# Patient Record
Sex: Male | Born: 1965 | Race: White | Hispanic: No | Smoking: Former smoker
Health system: Southern US, Community
[De-identification: ages and names within clinical notes are randomized; demographics above are authoritative.]

## PROBLEM LIST (undated history)

## (undated) DIAGNOSIS — I10 Essential (primary) hypertension: Secondary | ICD-10-CM

## (undated) DIAGNOSIS — G709 Myoneural disorder, unspecified: Secondary | ICD-10-CM

## (undated) DIAGNOSIS — E785 Hyperlipidemia, unspecified: Secondary | ICD-10-CM

## (undated) HISTORY — DX: Myoneural disorder, unspecified: G70.9

## (undated) HISTORY — DX: Hemochromatosis, unspecified: E83.119

## (undated) HISTORY — DX: Hyperlipidemia, unspecified: E78.5

## (undated) HISTORY — PX: TONSILLECTOMY: SUR1361

## (undated) HISTORY — PX: NOSE SURGERY: SHX723

## (undated) HISTORY — DX: Essential (primary) hypertension: I10

---

## 2005-12-30 ENCOUNTER — Ambulatory Visit: Payer: Self-pay | Admitting: Otolaryngology

## 2018-05-12 ENCOUNTER — Ambulatory Visit
Admission: RE | Admit: 2018-05-12 | Discharge: 2018-05-12 | Disposition: A | Discharge status: Home / Self Care | Payer: No Typology Code available for payment source | Source: Ambulatory Visit | Attending: Nurse Practitioner | Admitting: Nurse Practitioner

## 2018-05-12 ENCOUNTER — Other Ambulatory Visit: Payer: Self-pay | Admitting: Nurse Practitioner

## 2018-05-12 DIAGNOSIS — Z0000 Encounter for general adult medical examination without abnormal findings: Secondary | ICD-10-CM

## 2018-05-12 IMAGING — CR DG CHEST 2V
2 series · 2 of 2 positions shown · non-contrast
Comparison: None.

CLINICAL DATA: Retirement physical, ex-smoker.

EXAM:
CHEST - 2 VIEW

[w chest pa]
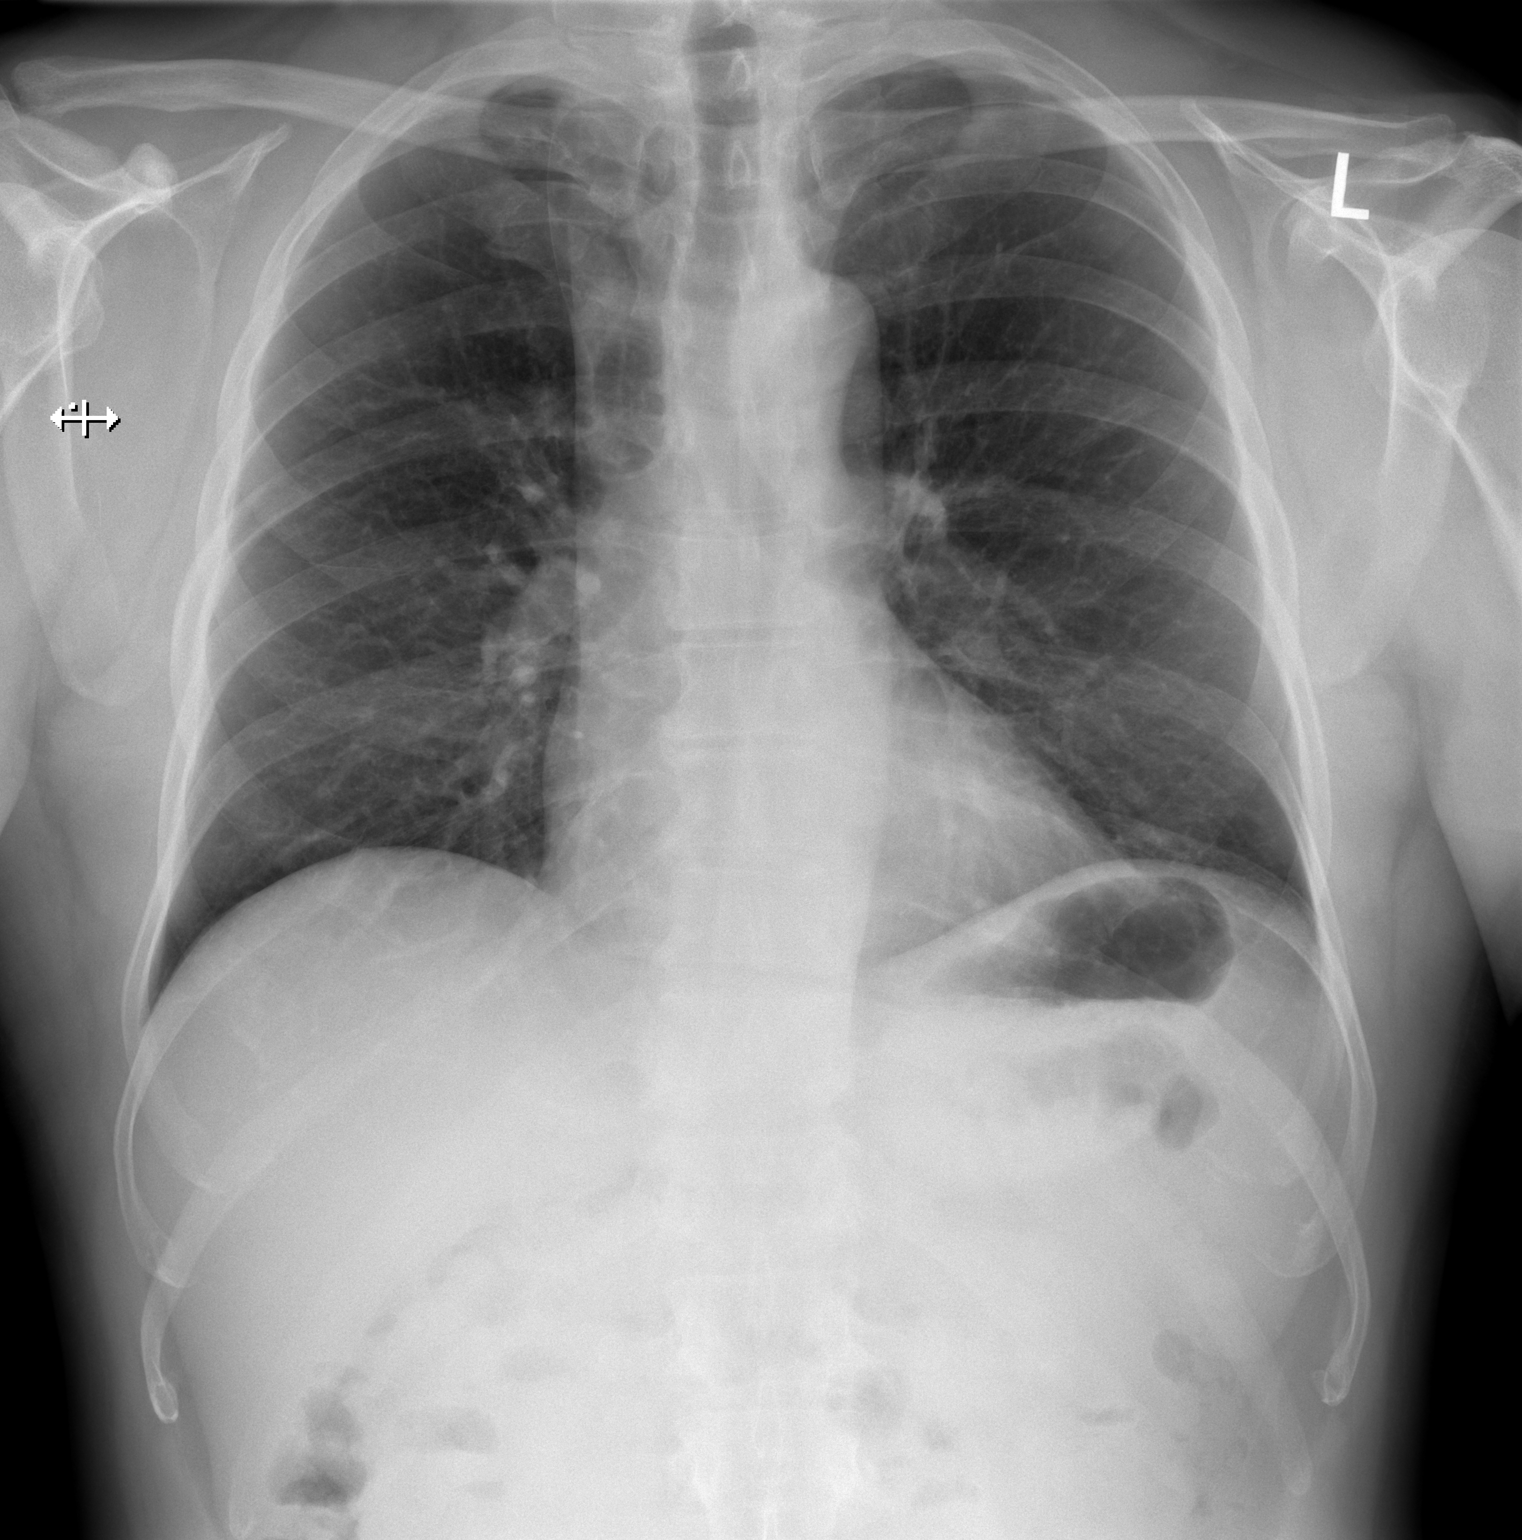

[w chest lat]
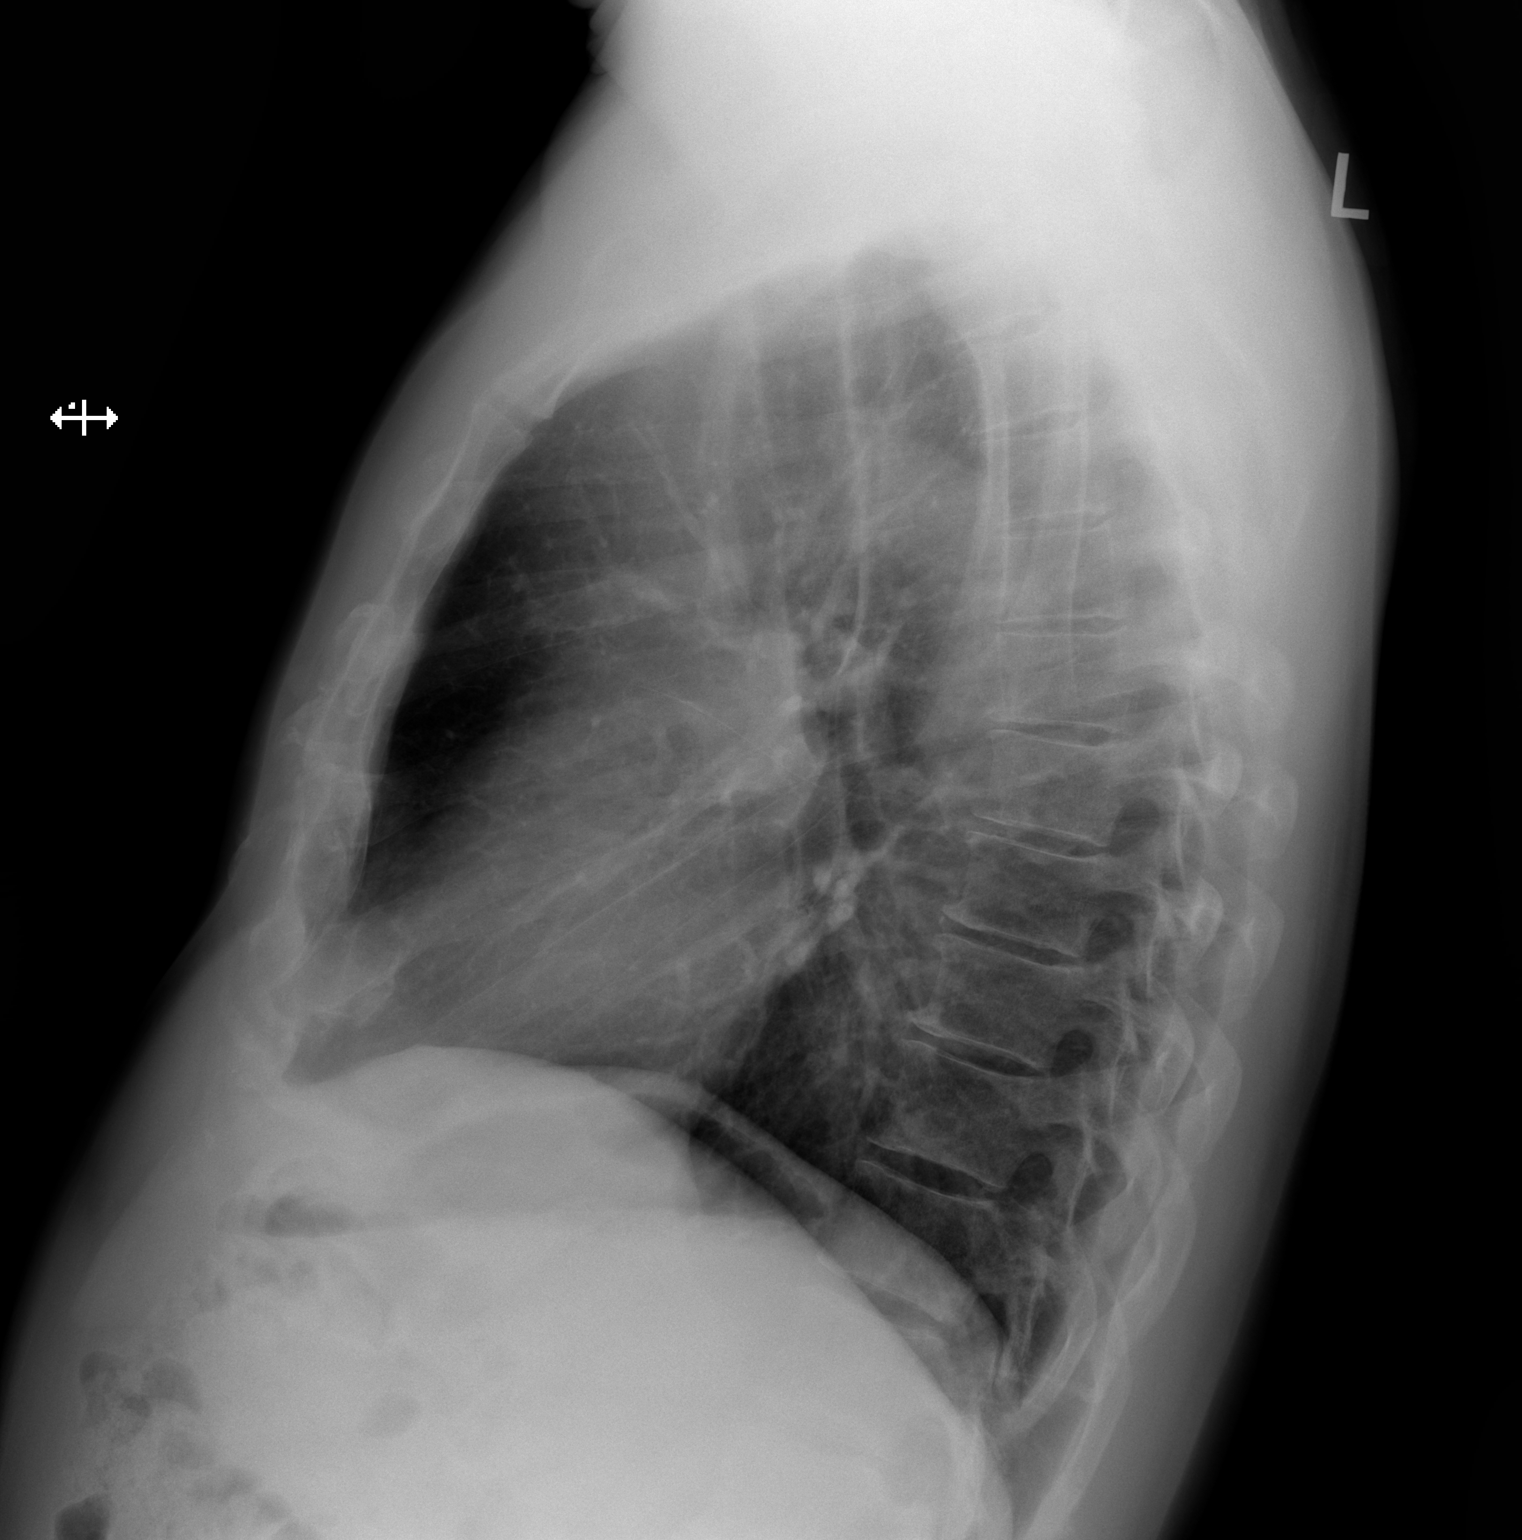

[2 of 2 positions shown; findings below may reference images not displayed]

FINDINGS: Trachea is midline. Heart size normal. Lungs are clear. No pleural
fluid.
IMPRESSION: Negative.

## 2020-01-03 ENCOUNTER — Other Ambulatory Visit: Payer: Self-pay | Admitting: Family Medicine

## 2020-01-03 ENCOUNTER — Ambulatory Visit: Payer: No Typology Code available for payment source | Admitting: Family Medicine

## 2020-01-03 VITALS — BP 174/110 | HR 88 | Ht 71.0 in | Wt 200.0 lb

## 2020-01-03 DIAGNOSIS — Z8669 Personal history of other diseases of the nervous system and sense organs: Secondary | ICD-10-CM

## 2020-01-03 DIAGNOSIS — R030 Elevated blood-pressure reading, without diagnosis of hypertension: Secondary | ICD-10-CM

## 2020-01-03 NOTE — Patient Instructions (Addendum)
- Call and schedule an appointment with an eye doctor  - Get established with a primary care doctor -Come back next week for a recheck of your blood pressure    DASH Eating Plan DASH stands for "Dietary Approaches to Stop Hypertension." The DASH eating plan is a healthy eating plan that has been shown to reduce high blood pressure (hypertension). It may also reduce your risk for type 2 diabetes, heart disease, and stroke. The DASH eating plan may also help with weight loss. What are tips for following this plan?  General guidelines  Avoid eating more than 2,300 mg (milligrams) of salt (sodium) a day. If you have hypertension, you may need to reduce your sodium intake to 1,500 mg a day.  Limit alcohol intake to no more than 1 drink a day for nonpregnant women and 2 drinks a day for men. One drink equals 12 oz of beer, 5 oz of wine, or 1 oz of hard liquor.  Work with your health care provider to maintain a healthy body weight or to lose weight. Ask what an ideal weight is for you.  Get at least 30 minutes of exercise that causes your heart to beat faster (aerobic exercise) most days of the week. Activities may include walking, swimming, or biking.  Work with your health care provider or diet and nutrition specialist (dietitian) to adjust your eating plan to your individual calorie needs. Reading food labels   Check food labels for the amount of sodium per serving. Choose foods with less than 5 percent of the Daily Value of sodium. Generally, foods with less than 300 mg of sodium per serving fit into this eating plan.  To find whole grains, look for the word "whole" as the first word in the ingredient list. Shopping  Buy products labeled as "low-sodium" or "no salt added."  Buy fresh foods. Avoid canned foods and premade or frozen meals. Cooking  Avoid adding salt when cooking. Use salt-free seasonings or herbs instead of table salt or sea salt. Check with your health care provider or  pharmacist before using salt substitutes.  Do not fry foods. Cook foods using healthy methods such as baking, boiling, grilling, and broiling instead.  Cook with heart-healthy oils, such as olive, canola, soybean, or sunflower oil. Meal planning  Eat a balanced diet that includes: ? 5 or more servings of fruits and vegetables each day. At each meal, try to fill half of your plate with fruits and vegetables. ? Up to 6-8 servings of whole grains each day. ? Less than 6 oz of lean meat, poultry, or fish each day. A 3-oz serving of meat is about the same size as a deck of cards. One egg equals 1 oz. ? 2 servings of low-fat dairy each day. ? A serving of nuts, seeds, or beans 5 times each week. ? Heart-healthy fats. Healthy fats called Omega-3 fatty acids are found in foods such as flaxseeds and coldwater fish, like sardines, salmon, and mackerel.  Limit how much you eat of the following: ? Canned or prepackaged foods. ? Food that is high in trans fat, such as fried foods. ? Food that is high in saturated fat, such as fatty meat. ? Sweets, desserts, sugary drinks, and other foods with added sugar. ? Full-fat dairy products.  Do not salt foods before eating.  Try to eat at least 2 vegetarian meals each week.  Eat more home-cooked food and less restaurant, buffet, and fast food.  When eating at a  restaurant, ask that your food be prepared with less salt or no salt, if possible. What foods are recommended? The items listed may not be a complete list. Talk with your dietitian about what dietary choices are best for you. Grains Whole-grain or whole-wheat bread. Whole-grain or whole-wheat pasta. Brown rice. [place]. Bulgur. Whole-grain and low-sodium cereals. Pita bread. Low-fat, low-sodium crackers. Whole-wheat flour tortillas. Vegetables Fresh or frozen vegetables (raw, steamed, roasted, or grilled). Low-sodium or reduced-sodium tomato and vegetable juice. Low-sodium or  reduced-sodium tomato sauce and tomato paste. Low-sodium or reduced-sodium canned vegetables. Fruits All fresh, dried, or frozen fruit. Canned fruit in natural juice (without added sugar). Meat and other protein foods Skinless chicken or [place]. Ground chicken or [place]. Pork with fat trimmed off. Fish and seafood. Egg whites. Dried beans, peas, or lentils. Unsalted nuts, nut butters, and seeds. Unsalted canned beans. Lean cuts of beef with fat trimmed off. Low-sodium, lean deli meat. Dairy Low-fat (1%) or fat-free (skim) milk. Fat-free, low-fat, or reduced-fat cheeses. Nonfat, low-sodium ricotta or cottage cheese. Low-fat or nonfat yogurt. Low-fat, low-sodium cheese. Fats and oils Soft margarine without trans fats. Vegetable oil. Low-fat, reduced-fat, or light mayonnaise and salad dressings (reduced-sodium). Canola, safflower, olive, soybean, and sunflower oils. Avocado. Seasoning and other foods Herbs. Spices. Seasoning mixes without salt. Unsalted popcorn and pretzels. Fat-free sweets. What foods are not recommended? The items listed may not be a complete list. Talk with your dietitian about what dietary choices are best for you. Grains Baked goods made with fat, such as croissants, muffins, or some breads. Dry pasta or rice meal packs. Vegetables Creamed or fried vegetables. Vegetables in a cheese sauce. Regular canned vegetables (not low-sodium or reduced-sodium). Regular canned tomato sauce and paste (not low-sodium or reduced-sodium). Regular tomato and vegetable juice (not low-sodium or reduced-sodium). Angie Fava. Olives. Fruits Canned fruit in a light or heavy syrup. Fried fruit. Fruit in cream or butter sauce. Meat and other protein foods Fatty cuts of meat. Ribs. Fried meat. Berniece Salines. Sausage. Bologna and other processed lunch meats. Salami. Fatback. Hotdogs. Bratwurst. Salted nuts and seeds. Canned beans with added salt. Canned or smoked fish. Whole eggs or egg yolks. Chicken or [place]  with skin. Dairy Whole or 2% milk, cream, and half-and-half. Whole or full-fat cream cheese. Whole-fat or sweetened yogurt. Full-fat cheese. Nondairy creamers. Whipped toppings. Processed cheese and cheese spreads. Fats and oils Butter. Stick margarine. Lard. Shortening. Ghee. Bacon fat. Tropical oils, such as coconut, palm kernel, or palm oil. Seasoning and other foods Salted popcorn and pretzels. Onion salt, garlic salt, seasoned salt, table salt, and sea salt. Worcestershire sauce. Tartar sauce. Barbecue sauce. Teriyaki sauce. Soy sauce, including reduced-sodium. Steak sauce. Canned and packaged gravies. Fish sauce. Oyster sauce. Cocktail sauce. Horseradish that you find on the shelf. Ketchup. Mustard. Meat flavorings and tenderizers. Bouillon cubes. Hot sauce and Tabasco sauce. Premade or packaged marinades. Premade or packaged taco seasonings. Relishes. Regular salad dressings. Where to find more information:  National Heart, Lung, and [place]: https://wilson-eaton.com/  American Heart Association: www.heart.org Summary  The DASH eating plan is a healthy eating plan that has been shown to reduce high blood pressure (hypertension). It may also reduce your risk for type 2 diabetes, heart disease, and stroke.  With the DASH eating plan, you should limit salt (sodium) intake to 2,300 mg a day. If you have hypertension, you may need to reduce your sodium intake to 1,500 mg a day.  When on the DASH eating plan, aim to eat more  fresh fruits and vegetables, whole grains, lean proteins, low-fat dairy, and heart-healthy fats.  Work with your health care provider or diet and nutrition specialist (dietitian) to adjust your eating plan to your individual calorie needs. This information is not intended to replace advice given to you by your health care provider. Make sure you discuss any questions you have with your health care provider. Document Revised: 03/25/2017 Document Reviewed:  04/05/2016 Elsevier Patient Education  2020 [employer].

## 2020-01-03 NOTE — Progress Notes (Signed)
Subjective:     Patient ID: Cameron Herrera, male   DOB: 01/28/1966, 54 y.o.   MRN: [id number]  HPI Cameron Herrera presents to the employee health and wellness clinic today with concerns over double vision. He reports sudden onset of diplopia yesterday 9/8 around lunch time when he was playing solitaire on his phone. States it lasted about 3 minutes and resolved spontaneously. He reports the images were horizontal and overlapping. He reports vision was clear and both images were clear. States the double vision was worse with distance vision. Unsure if it was in one eye or both. He denies any h/a, N/V, drooping eyelid, facial drooping, slurred speech, dizziness, chest pain or shortness of breath. He states he felt completely normal. He looked at himself in the mirror and states his eyes looked normal at the time. Denies any hx of anything similar.   He does not have a PCP. He reports his last physical was his exit physical when he retired from the [employer] in January 2020.  He has never had an eye exam. He states he has a hx of his BP being borderline high in the past, usually 135-140/80-95. States he was never diagnosed with HTN or treated. He denies any PMH, no personal hx of thyroid disease or diabetes. He reports only taking a multivitamin daily, no other supplements or rx medications. He does report daily alcohol use, 3-4 beers a day. He quit smoking 3 years ago but does continue to use smokeless tobacco. He is interested in quitting.   No past medical history on file. No Known Allergies  Current Outpatient Medications:  [name] Kitchen  Multiple Vitamin (MULTIVITAMIN) tablet, Take 1 tablet by mouth daily., Disp: , Rfl:     Review of Systems  Constitutional: Negative for activity change, appetite change, chills, fever and unexpected weight change.  HENT: Negative for congestion, ear pain, facial swelling, sinus pressure, sinus pain, sore throat, tinnitus and trouble swallowing.   Eyes: Positive for visual  disturbance (diplopia, see HPI). Negative for photophobia, pain, discharge, redness and itching.  Respiratory: Negative for cough, shortness of breath and wheezing.   Cardiovascular: Negative for chest pain and palpitations.  Gastrointestinal: Negative for abdominal pain, nausea and vomiting.  Musculoskeletal: Negative for neck pain and neck stiffness.  Skin: Negative for color change.  Neurological: Negative for dizziness, syncope, facial asymmetry, speech difficulty, weakness, light-headedness, numbness and headaches.  Psychiatric/Behavioral: Negative for dysphoric mood. The patient is not nervous/anxious.        Objective:   Physical Exam Vitals reviewed.  Constitutional:      General: He is not in acute distress.    Appearance: Normal appearance. He is not toxic-appearing.  HENT:     Head: Normocephalic and atraumatic.     Jaw: No tenderness.     Comments: No scalp or temporal tenderness on palpation    Right Ear: Tympanic membrane, ear canal and external ear normal.     Left Ear: Tympanic membrane, ear canal and external ear normal.     Nose: Nose normal.  Eyes:     General: Lids are normal. Vision grossly intact. Gaze aligned appropriately.        Right eye: No discharge.        Left eye: No discharge.     Extraocular Movements: Extraocular movements intact.     Right eye: No nystagmus.     Left eye: No nystagmus.     Conjunctiva/sclera: Conjunctivae normal.     Pupils: Pupils are equal,  round, and reactive to light.  Cardiovascular:     Rate and Rhythm: Normal rate and regular rhythm.     Heart sounds: Normal heart sounds.  Pulmonary:     Effort: Pulmonary effort is normal. No respiratory distress.     Breath sounds: Normal breath sounds.  Musculoskeletal:     Cervical back: Neck supple. No tenderness.  Lymphadenopathy:     Cervical: No cervical adenopathy.  Skin:    General: Skin is warm and dry.  Neurological:     General: No focal deficit present.     Mental  Status: He is alert and oriented to person, place, and time.     Cranial Nerves: Cranial nerves are intact. No cranial nerve deficit.     Motor: Motor function is intact. No weakness.     Coordination: Coordination is intact. Coordination normal.     Gait: Gait is intact. Gait normal.  Psychiatric:        Mood and Affect: Mood normal.        Behavior: Behavior normal.    Today's Vitals   01/03/20 1336  BP: (!) 174/110  Pulse: 88  SpO2: 98%  Weight: 200 lb (90.7 kg)  Height: 5\' 11"  (1.803 m)   Body mass index is 27.89 kg/m.     Assessment:     H/O diplopia  Elevated blood pressure reading in office without diagnosis of hypertension      Plan:     1. Acute onset diplopia that resolved spontaneously after a few minutes. Recommend pt schedule an eye exam for further evaluation and get established with a PCP. Will check some lab work here today and notify pt of results. Lipid panel, CBC, CMP, TSH, A1c.  2. BP significantly elevated in office today. He will f/u in 1 week for recheck. If remains elevated then we will discuss starting on BP lowering medication. Discussed importance of DASH diet, exercise. Again, recommend eye exam and management by PCP. 3. Discussed with pt should he develop diplopia again or any other concerning symptoms, like severe h/a, vomiting, slurred speech, weakness, chest pain, shortness of breath, or any other concerning symptoms he should call 911. He verbalized understanding.

## 2020-01-04 LAB — CBC
HCT: 49.6 % (ref 38.5–50.0)
Hemoglobin: 17.6 g/dL — ABNORMAL HIGH (ref 13.2–17.1)
MCH: 33.8 pg — ABNORMAL HIGH (ref 27.0–33.0)
MCHC: 35.5 g/dL (ref 32.0–36.0)
MCV: 95.4 fL (ref 80.0–100.0)
MPV: 10.2 fL (ref 7.5–12.5)
Platelets: 257 10*3/uL (ref 140–400)
RBC: 5.2 10*6/uL (ref 4.20–5.80)
RDW: 12.5 % (ref 11.0–15.0)
WBC: 8.2 10*3/uL (ref 3.8–10.8)

## 2020-01-04 LAB — TSH: TSH: 1.09 mIU/L (ref 0.40–4.50)

## 2020-01-04 LAB — COMPLETE METABOLIC PANEL WITH GFR
AG Ratio: 1.7 (calc) (ref 1.0–2.5)
ALT: 22 U/L (ref 9–46)
AST: 17 U/L (ref 10–35)
Albumin: 4.3 g/dL (ref 3.6–5.1)
Alkaline phosphatase (APISO): 78 U/L (ref 35–144)
BUN: 15 mg/dL (ref 7–25)
CO2: 23 mmol/L (ref 20–32)
Calcium: 9 mg/dL (ref 8.6–10.3)
Chloride: 104 mmol/L (ref 98–110)
Creat: 1.04 mg/dL (ref 0.70–1.33)
GFR, Est African American: 93 mL/min/{1.73_m2} (ref >=60)
GFR, Est Non African American: 80 mL/min/{1.73_m2} (ref >=60)
Globulin: 2.5 g/dL (calc) (ref 1.9–3.7)
Glucose, Bld: 111 mg/dL (ref 65–139)
Potassium: 4 mmol/L (ref 3.5–5.3)
Sodium: 136 mmol/L (ref 135–146)
Total Bilirubin: 1.5 mg/dL — ABNORMAL HIGH (ref 0.2–1.2)
Total Protein: 6.8 g/dL (ref 6.1–8.1)

## 2020-01-04 LAB — LIPID PANEL
Cholesterol: 148 mg/dL (ref <200)
HDL: 44 mg/dL (ref >=40)
LDL Cholesterol (Calc): 72 mg/dL (calc)
Non-HDL Cholesterol (Calc): 104 mg/dL (calc) (ref <130)
Total CHOL/HDL Ratio: 3.4 (calc) (ref <5.0)
Triglycerides: 227 mg/dL — ABNORMAL HIGH (ref <150)

## 2020-01-04 LAB — HEMOGLOBIN A1C W/OUT EAG: Hgb A1c MFr Bld: 5.1 % of total Hgb (ref <5.7)

## 2020-01-04 LAB — PSA: PSA: 0.6 ng/mL (ref <=4.0)

## 2020-01-08 NOTE — Progress Notes (Signed)
Spoke with Jillyn Hidden via telephone to review lab results. Overall cholesterol looks good, triglycerides likely elevated due to not fasting. A1c and thyroid function normal. PSA normal. Bilirubin is slightly elevated, which pt states has always been that way. Hgb slightly elevated. Will have copy of labs when see pt for f/u later this week. Recommend he schedule an eye exam and get established with PCP.

## 2020-01-10 ENCOUNTER — Ambulatory Visit: Payer: No Typology Code available for payment source | Admitting: Family Medicine

## 2020-01-10 VITALS — BP 158/98 | HR 98

## 2020-01-10 DIAGNOSIS — I10 Essential (primary) hypertension: Secondary | ICD-10-CM | POA: Insufficient documentation

## 2020-01-10 MED ORDER — LISINOPRIL 10 MG PO TABS: 10.0000 mg | ORAL_TABLET | Freq: Every day | ORAL | 2 refills | Status: DC

## 2020-01-10 NOTE — Patient Instructions (Signed)

## 2020-01-10 NOTE — Progress Notes (Signed)
  Subjective:     Patient ID: Cameron Herrera, male   DOB: May 26, 1965, 54 y.o.   MRN: [id number]  HPI Cameron Herrera presents for f/u of his elevated BP at his last visit. He denies any current problems or concerns. He has had no repeat incidences of the double vision. He is working on getting a PCP established and an appt for an eye exam.   No past medical history on file. No Known Allergies  Current Outpatient Medications:  .  lisinopril (ZESTRIL) 10 MG tablet, Take 1 tablet (10 mg total) by mouth daily., Disp: 30 tablet, Rfl: 2 .  Multiple Vitamin (MULTIVITAMIN) tablet, Take 1 tablet by mouth daily., Disp: , Rfl:    Review of Systems  Constitutional: Negative.   HENT: Negative.   Eyes: Negative.   Respiratory: Negative.   Cardiovascular: Negative.   Genitourinary: Negative.   Neurological: Negative.        Objective:   Physical Exam Vitals reviewed.  Constitutional:      General: He is not in acute distress.    Appearance: Normal appearance. He is not toxic-appearing.  HENT:     Head: Normocephalic and atraumatic.  Eyes:     General:        Right eye: No discharge.        Left eye: No discharge.  Cardiovascular:     Rate and Rhythm: Normal rate.  Pulmonary:     Effort: Pulmonary effort is normal. No respiratory distress.  Skin:    General: Skin is warm and dry.  Neurological:     Mental Status: He is alert and oriented to person, place, and time.  Psychiatric:        Mood and Affect: Mood normal.        Behavior: Behavior normal.     Today's Vitals   01/10/20 1341  BP: (!) 158/98  Pulse: 98  SpO2: 97%   There is no height or weight on file to calculate BMI.      Assessment:     Essential hypertension      Plan:     1. Discussed with pt diagnosis of HTN. Will treat initially with lisinopril 10 mg daily. Risks and benefits discussed. Recommend following DASH diet and increasing physical activity. Handout given. Encouraged establishing care with PCP for long-term  management. F/u here in 1 month to evaluate treatment response or sooner if needed. Pt will have a nurse at work check his BP a few times a week and keep a log of it.

## 2020-02-07 ENCOUNTER — Ambulatory Visit: Payer: No Typology Code available for payment source | Admitting: Family Medicine

## 2020-02-07 VITALS — BP 144/84 | HR 100

## 2020-02-07 DIAGNOSIS — I10 Essential (primary) hypertension: Secondary | ICD-10-CM

## 2020-02-07 MED ORDER — LISINOPRIL 20 MG PO TABS: 20.0000 mg | ORAL_TABLET | Freq: Every day | ORAL | 0 refills | Status: DC

## 2020-02-07 NOTE — Progress Notes (Signed)
  Subjective:     Patient ID: Cameron Herrera, male   DOB: October 25, 1965, 54 y.o.   MRN: [id number]  HPI Cameron Herrera presents to the employee health and wellness clinic today for f/u of his HTN. He reports taking his lisinopril as prescribed without problem. He denies any adverse effects. He reports he has had his BP checked occasionally at work and it's been in the 140s over 80s. He has an eye exam coming up in the next couple weeks. He still needs to establish care with a PCP.  Past Medical History:  Diagnosis Date  . HTN (hypertension)    No Known Allergies   Current Outpatient Medications:  .  lisinopril (ZESTRIL) 20 MG tablet, Take 1 tablet (20 mg total) by mouth daily., Disp: 90 tablet, Rfl: 0 .  Multiple Vitamin (MULTIVITAMIN) tablet, Take 1 tablet by mouth daily., Disp: , Rfl:    Review of Systems  Constitutional: Negative for chills and fever.  HENT: Negative.   Respiratory: Negative for shortness of breath.   Cardiovascular: Negative for chest pain, palpitations and leg swelling.  Gastrointestinal: Negative for abdominal pain, nausea and vomiting.  Neurological: Negative for dizziness, syncope, weakness, light-headedness and headaches.       Objective:   Physical Exam Vitals reviewed.  Constitutional:      General: He is not in acute distress.    Appearance: Normal appearance. He is well-developed.  HENT:     Head: Normocephalic and atraumatic.  Eyes:     General:        Right eye: No discharge.        Left eye: No discharge.  Cardiovascular:     Rate and Rhythm: Normal rate and regular rhythm.     Heart sounds: Normal heart sounds.  Pulmonary:     Effort: Pulmonary effort is normal. No respiratory distress.     Breath sounds: Normal breath sounds.  Musculoskeletal:     Cervical back: Neck supple.  Skin:    General: Skin is warm and dry.  Neurological:     Mental Status: He is alert and oriented to person, place, and time.  Psychiatric:        Mood and Affect: Mood  normal.        Behavior: Behavior normal.    Today's Vitals   02/07/20 1451  BP: (!) 144/84  Pulse: 100  SpO2: 97%   There is no height or weight on file to calculate BMI.      Assessment:     Essential hypertension      Plan:     1. Increased lisinopril to 20 mg daily. Encouraged following DASH diet and increasing physical activity. Continue to monitor BP at work. Need PCP to take over management. F/u if any problems or concerns. RTC in 1-2 months for BP recheck.

## 2020-04-03 ENCOUNTER — Ambulatory Visit: Payer: No Typology Code available for payment source | Admitting: Family Medicine

## 2020-04-03 VITALS — BP 176/90 | HR 100

## 2020-04-03 DIAGNOSIS — M545 Low back pain, unspecified: Secondary | ICD-10-CM

## 2020-04-03 DIAGNOSIS — I10 Essential (primary) hypertension: Secondary | ICD-10-CM

## 2020-04-03 MED ORDER — LISINOPRIL 20 MG PO TABS
20.0000 mg | ORAL_TABLET | Freq: Every day | ORAL | 0 refills | Status: DC
Start: 2020-04-03 — End: 2021-05-04

## 2020-04-03 MED ORDER — CYCLOBENZAPRINE HCL 5 MG PO TABS: 5.0000 mg | ORAL_TABLET | Freq: Three times a day (TID) | ORAL | 1 refills | Status: DC | PRN

## 2020-04-03 NOTE — Progress Notes (Signed)
Subjective:     Patient ID: Cameron Herrera, male   DOB: 1965-05-29, 54 y.o.   MRN: [id number]  HPI  Cameron Herrera presents to the employee health clinic today for evaluation of low back pain. Cameron Herrera reports over the weekend Cameron Herrera was laying some flooring and afterwards had pain to his mid low back. States since then the pain has improved some but becomes very stiff after being in one position for too long. Pain is improved when standing. Cameron Herrera denies any radiation of the pain, no neck pain, no numbness or tingling, no weakness. Cameron Herrera denies any problems with urinating or having bowel movements. Cameron Herrera reports having similar problem but over 20 years ago, and at that time Cameron Herrera saw a chiropractor which helped. Cameron Herrera has tried taking 400 mg ibuprofen prn, however states this upset his stomach so Cameron Herrera stopped.   Cameron Herrera is also requesting a refill of his lisinopril. Cameron Herrera is compliant with medication. Does not check BP at home. Denies any adverse effects. Cameron Herrera needs to schedule an appointment with PCP for further follow up.   Past Medical History:  Diagnosis Date  . HTN (hypertension)    No Known Allergies  Current Outpatient Medications:  [name] Kitchen  Multiple Vitamin (MULTIVITAMIN) tablet, Take 1 tablet by mouth daily., Disp: , Rfl:  .  cyclobenzaprine (FLEXERIL) 5 MG tablet, Take 1 tablet (5 mg total) by mouth 3 (three) times daily as needed for muscle spasms., Disp: 15 tablet, Rfl: 1 .  lisinopril (ZESTRIL) 20 MG tablet, Take 1 tablet (20 mg total) by mouth daily., Disp: 90 tablet, Rfl: 0   Review of Systems  Constitutional: Negative for chills and fever.  Respiratory: Negative for shortness of breath.   Cardiovascular: Negative for chest pain, palpitations and leg swelling.  Genitourinary: Negative for difficulty urinating.  Musculoskeletal: Positive for back pain. Negative for gait problem, neck pain and neck stiffness.  Neurological: Negative for dizziness, weakness, numbness and headaches.       Objective:   Physical Exam Vitals  reviewed.  Constitutional:      General: Cameron Herrera is not in acute distress.    Appearance: Normal appearance. Cameron Herrera is well-developed.  HENT:     Head: Normocephalic and atraumatic.  Eyes:     General:        Right eye: No discharge.        Left eye: No discharge.  Cardiovascular:     Rate and Rhythm: Normal rate and regular rhythm.     Heart sounds: Normal heart sounds.  Pulmonary:     Effort: Pulmonary effort is normal. No respiratory distress.     Breath sounds: Normal breath sounds.  Musculoskeletal:     Cervical back: Normal and neck supple.     Thoracic back: Normal.     Lumbar back: Tenderness present. No bony tenderness. Negative right straight leg raise test and negative left straight leg raise test.  Skin:    General: Skin is warm and dry.  Neurological:     Mental Status: Cameron Herrera is alert and oriented to person, place, and time.  Psychiatric:        Mood and Affect: Mood normal.        Behavior: Behavior normal.        Assessment:     Acute midline low back pain without sciatica  Essential hypertension      Plan:     1. Pain likely musculoskeletal in nature based on exam. No red flags noted. Recommend conservative treatment options, gentle  stretching, cyclobenzaprine prn - cautioned drowsiness. Discussed importance of taking ibuprofen with food to avoid GI upset. If symptoms worsen or fail to improve over the next several weeks recommend f/u. If severe pain, weakness, numbness/tingling, incontinence develops then go to ED.  2. BP elevated at today's visit. Current medication continued and refill given. Encouraged visit with PCP for continued management. Until then f/u here in 1-2 weeks for BP recheck.

## 2020-04-03 NOTE — Patient Instructions (Signed)

## 2021-04-26 DIAGNOSIS — I639 Cerebral infarction, unspecified: Secondary | ICD-10-CM

## 2021-04-26 HISTORY — DX: Cerebral infarction, unspecified: I63.9

## 2021-05-02 ENCOUNTER — Emergency Department (HOSPITAL_COMMUNITY): Payer: 59

## 2021-05-02 ENCOUNTER — Other Ambulatory Visit: Payer: Self-pay

## 2021-05-02 ENCOUNTER — Encounter (HOSPITAL_COMMUNITY): Payer: Self-pay | Admitting: Internal Medicine

## 2021-05-02 ENCOUNTER — Inpatient Hospital Stay (HOSPITAL_COMMUNITY)
Admission: EM | Admit: 2021-05-02 | Discharge: 2021-05-04 | DRG: 065 | Disposition: A | Discharge status: Home / Self Care | Payer: 59 | Attending: Family Medicine | Admitting: Family Medicine

## 2021-05-02 DIAGNOSIS — E785 Hyperlipidemia, unspecified: Secondary | ICD-10-CM | POA: Diagnosis present

## 2021-05-02 DIAGNOSIS — Z789 Other specified health status: Secondary | ICD-10-CM

## 2021-05-02 DIAGNOSIS — Z833 Family history of diabetes mellitus: Secondary | ICD-10-CM

## 2021-05-02 DIAGNOSIS — R270 Ataxia, unspecified: Secondary | ICD-10-CM | POA: Diagnosis present

## 2021-05-02 DIAGNOSIS — I6389 Other cerebral infarction: Secondary | ICD-10-CM | POA: Diagnosis not present

## 2021-05-02 DIAGNOSIS — E538 Deficiency of other specified B group vitamins: Secondary | ICD-10-CM | POA: Diagnosis present

## 2021-05-02 DIAGNOSIS — Z87891 Personal history of nicotine dependence: Secondary | ICD-10-CM

## 2021-05-02 DIAGNOSIS — Z20822 Contact with and (suspected) exposure to covid-19: Secondary | ICD-10-CM | POA: Diagnosis present

## 2021-05-02 DIAGNOSIS — I6381 Other cerebral infarction due to occlusion or stenosis of small artery: Secondary | ICD-10-CM | POA: Diagnosis present

## 2021-05-02 DIAGNOSIS — Z825 Family history of asthma and other chronic lower respiratory diseases: Secondary | ICD-10-CM | POA: Diagnosis not present

## 2021-05-02 DIAGNOSIS — Z8249 Family history of ischemic heart disease and other diseases of the circulatory system: Secondary | ICD-10-CM

## 2021-05-02 DIAGNOSIS — D751 Secondary polycythemia: Secondary | ICD-10-CM | POA: Diagnosis present

## 2021-05-02 DIAGNOSIS — R29703 NIHSS score 3: Secondary | ICD-10-CM | POA: Diagnosis present

## 2021-05-02 DIAGNOSIS — G8194 Hemiplegia, unspecified affecting left nondominant side: Secondary | ICD-10-CM | POA: Diagnosis present

## 2021-05-02 DIAGNOSIS — I639 Cerebral infarction, unspecified: Secondary | ICD-10-CM | POA: Diagnosis not present

## 2021-05-02 DIAGNOSIS — F1010 Alcohol abuse, uncomplicated: Secondary | ICD-10-CM | POA: Diagnosis present

## 2021-05-02 DIAGNOSIS — I10 Essential (primary) hypertension: Secondary | ICD-10-CM | POA: Diagnosis present

## 2021-05-02 LAB — COMPREHENSIVE METABOLIC PANEL
ALT: 34 U/L (ref 0–44)
AST: 25 U/L (ref 15–41)
Albumin: 4.4 g/dL (ref 3.5–5.0)
Alkaline Phosphatase: 67 U/L (ref 38–126)
Anion gap: 11 (ref 5–15)
BUN: 12 mg/dL (ref 6–20)
CO2: 26 mmol/L (ref 22–32)
Calcium: 9.1 mg/dL (ref 8.9–10.3)
Chloride: 103 mmol/L (ref 98–111)
Creatinine, Ser: 0.98 mg/dL (ref 0.61–1.24)
GFR, Estimated: >60 mL/min (ref >60)
Glucose, Bld: 104 mg/dL — ABNORMAL HIGH (ref 70–99)
Potassium: 3.7 mmol/L (ref 3.5–5.1)
Sodium: 140 mmol/L (ref 135–145)
Total Bilirubin: 1.5 mg/dL — ABNORMAL HIGH (ref 0.3–1.2)
Total Protein: 7.3 g/dL (ref 6.5–8.1)

## 2021-05-02 LAB — CBC
HCT: 51 % (ref 39.0–52.0)
Hemoglobin: 18 g/dL — ABNORMAL HIGH (ref 13.0–17.0)
MCH: 33.2 pg (ref 26.0–34.0)
MCHC: 35.3 g/dL (ref 30.0–36.0)
MCV: 94.1 fL (ref 80.0–100.0)
Platelets: 240 10*3/uL (ref 150–400)
RBC: 5.42 MIL/uL (ref 4.22–5.81)
RDW: 11.6 % (ref 11.5–15.5)
WBC: 9.1 10*3/uL (ref 4.0–10.5)
nRBC: 0 % (ref 0.0–0.2)

## 2021-05-02 LAB — I-STAT CHEM 8, ED
BUN: 15 mg/dL (ref 6–20)
Calcium, Ion: 1.11 mmol/L — ABNORMAL LOW (ref 1.15–1.40)
Chloride: 101 mmol/L (ref 98–111)
Creatinine, Ser: 0.9 mg/dL (ref 0.61–1.24)
Glucose, Bld: 103 mg/dL — ABNORMAL HIGH (ref 70–99)
HCT: 52 % (ref 39.0–52.0)
Hemoglobin: 17.7 g/dL — ABNORMAL HIGH (ref 13.0–17.0)
Potassium: 3.8 mmol/L (ref 3.5–5.1)
Sodium: 138 mmol/L (ref 135–145)
TCO2: 26 mmol/L (ref 22–32)

## 2021-05-02 LAB — DIFFERENTIAL
Abs Immature Granulocytes: 0.05 10*3/uL (ref 0.00–0.07)
Basophils Absolute: 0.1 10*3/uL (ref 0.0–0.1)
Basophils Relative: 1 %
Eosinophils Absolute: 0.1 10*3/uL (ref 0.0–0.5)
Eosinophils Relative: 1 %
Immature Granulocytes: 1 %
Lymphocytes Relative: 25 %
Lymphs Abs: 2.2 10*3/uL (ref 0.7–4.0)
Monocytes Absolute: 0.7 10*3/uL (ref 0.1–1.0)
Monocytes Relative: 8 %
Neutro Abs: 5.9 10*3/uL (ref 1.7–7.7)
Neutrophils Relative %: 64 %

## 2021-05-02 LAB — APTT: aPTT: 30 seconds (ref 24–36)

## 2021-05-02 LAB — PROTIME-INR
INR: 1 (ref 0.8–1.2)
Prothrombin Time: 13 seconds (ref 11.4–15.2)

## 2021-05-02 IMAGING — MR MR HEAD W/O CM
10 of 11 series · 43 of 48 positions shown · non-contrast
Comparison: None.

CLINICAL DATA: Left facial numbness

EXAM:
MRI HEAD WITHOUT CONTRAST
TECHNIQUE: Multiplanar, multiecho pulse sequences of the brain and surrounding
structures were obtained without intravenous contrast.

[Series 5: DWI · axial · 3.0mm · 0.88mm/px · z∈[-54,+95]mm · 10 of 102 slices shown (1 of 4)]
[im 1/102]
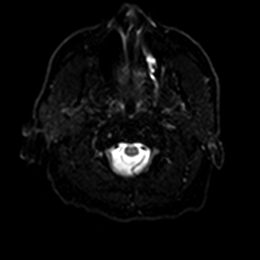
[im 12/102]
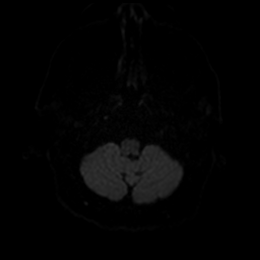
[im 23/102]
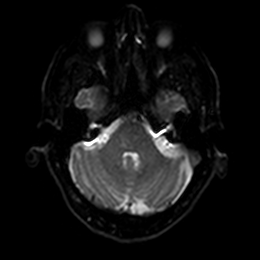
[im 34/102]
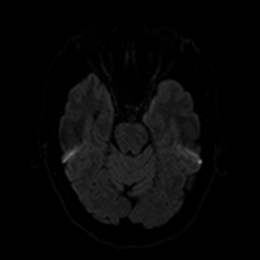
[im 45/102]
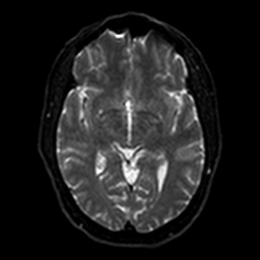
[im 57/102]
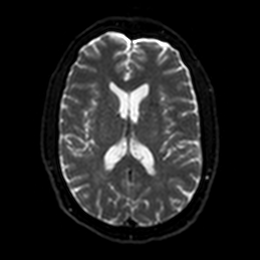
[im 68/102]
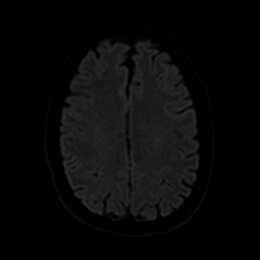
[im 79/102]
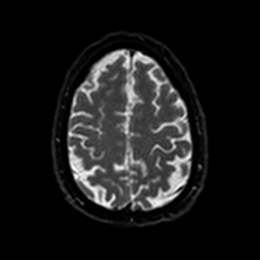
[im 90/102]
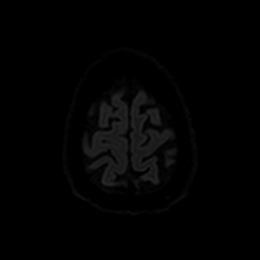
[im 102/102]
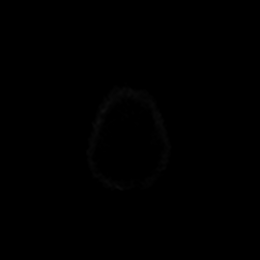

[Series 6: DWI · axial · 3.0mm · 0.88mm/px · z∈[-54,+95]mm · 5 of 50 slices shown (2 of 4)]
[im 1/50]
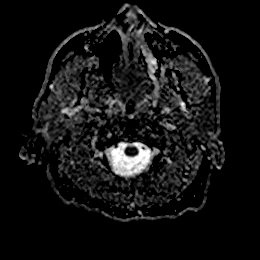
[im 13/50]
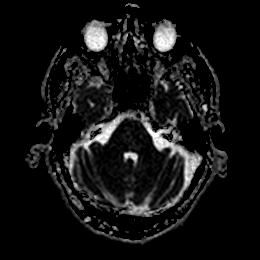
[im 25/50]
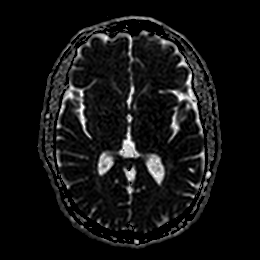
[im 37/50]
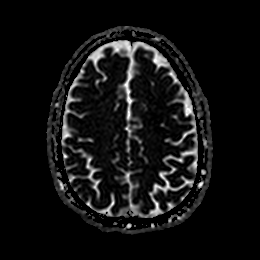
[im 50/50]
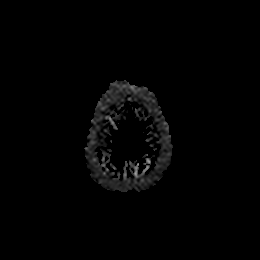

[Series 7: DWI · coronal · 4.0mm · 0.88mm/px · 6 of 68 slices shown (3 of 4)]
[im 1/68]
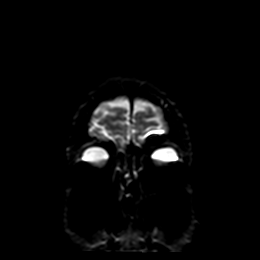
[im 14/68]
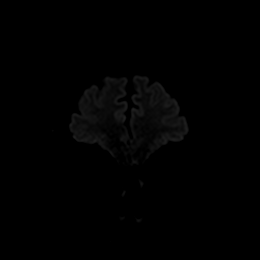
[im 27/68]
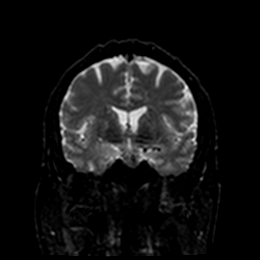
[im 41/68]
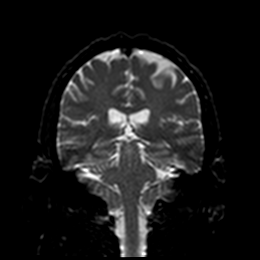
[im 54/68]
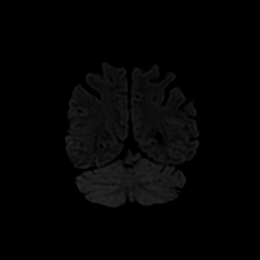
[im 68/68]
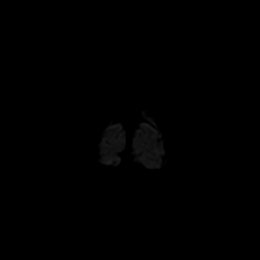

[Series 8: DWI · coronal · 4.0mm · 0.88mm/px · 3 of 34 slices shown (4 of 4)]
[im 1/34]
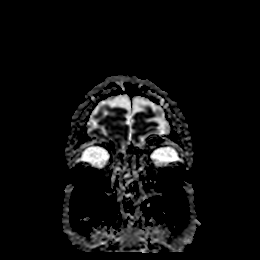
[im 17/34]
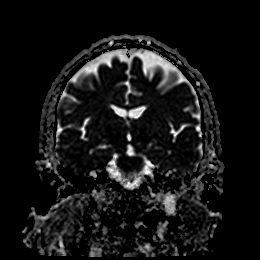
[im 34/34]
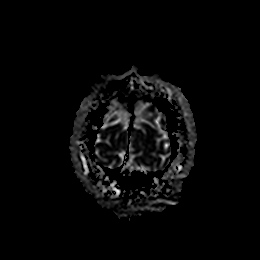

[Series 9: T1 · sagittal · 5.0mm · 0.75mm/px · 2 of 23 slices shown]
[im 1/23]
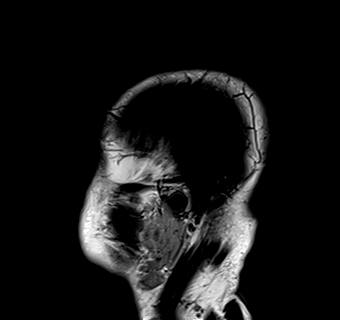
[im 23/23]
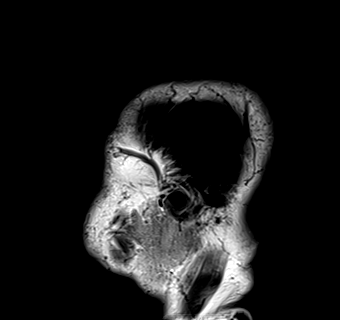

[Series 10: T2 · axial · 5.0mm · 0.72mm/px · z∈[-57,+98]mm · 2 of 27 slices shown (1 of 2)]
[im 1/27]
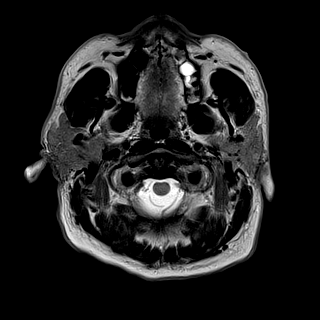
[im 27/27]
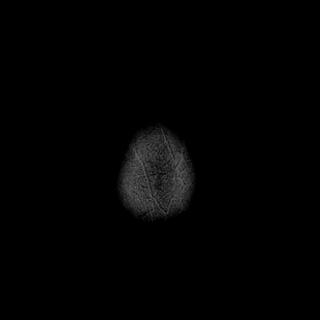

[Series 11: FLAIR · axial · 5.0mm · 0.45mm/px · z∈[-56,+100]mm · 2 of 27 slices shown]
[im 1/27]
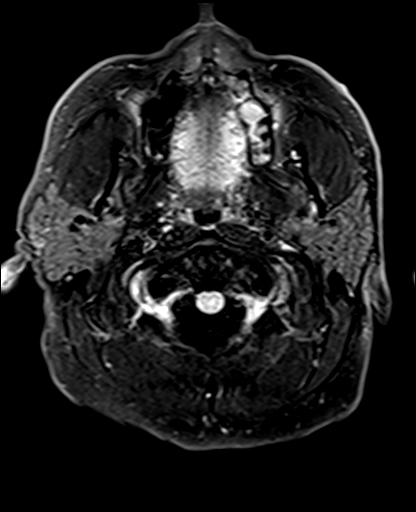
[im 27/27]
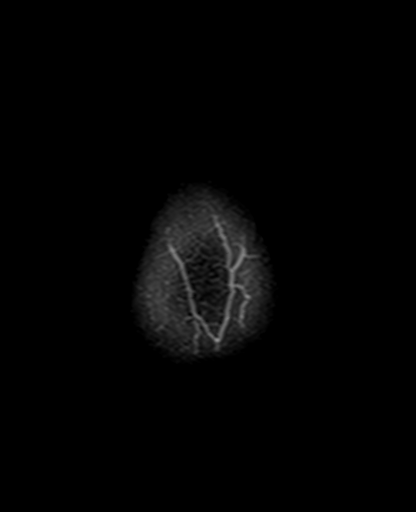

[Series 13: pha_images · axial · 3.0mm · 0.90mm/px · z∈[-67,+107]mm · 5 of 58 slices shown]
[im 1/58]
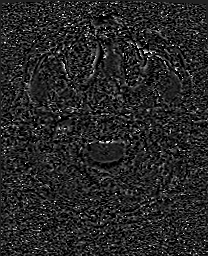
[im 15/58]
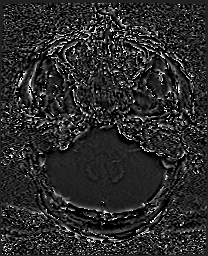
[im 29/58]
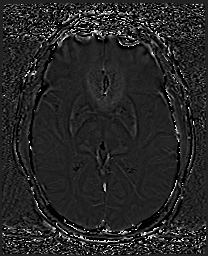
[im 43/58]
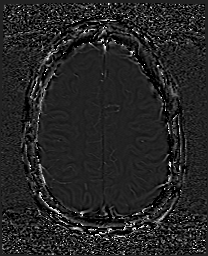
[im 58/58]
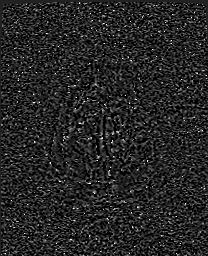

[Series 14: swi_images · axial · 3.0mm · 0.90mm/px · z∈[-67,+110]mm · 5 of 60 slices shown]
[im 1/60]
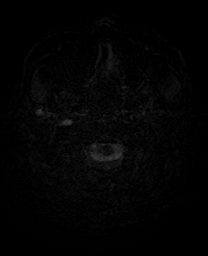
[im 15/60]
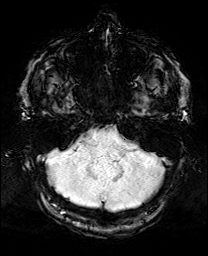
[im 30/60]
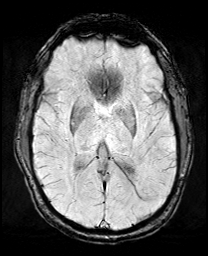
[im 45/60]
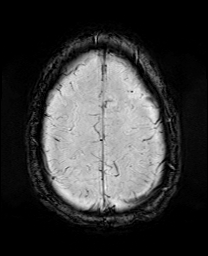
[im 60/60]
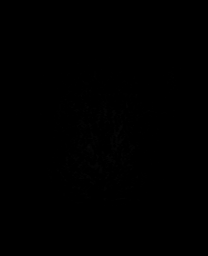

[Series 17: T2 · coronal · 5.0mm · 0.34mm/px · 3 of 29 slices shown (2 of 2)]
[im 1/29]
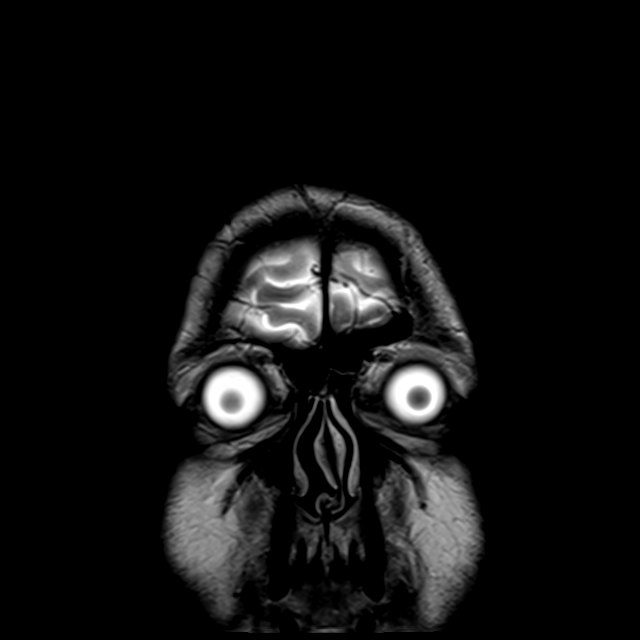
[im 15/29]
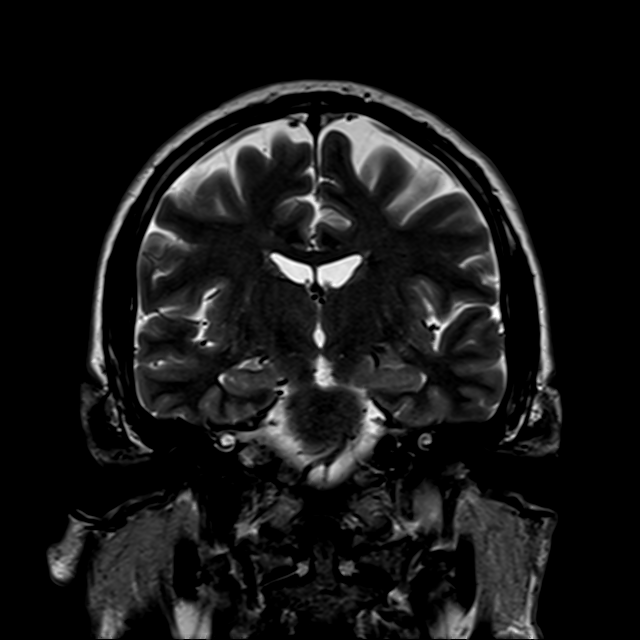
[im 29/29]
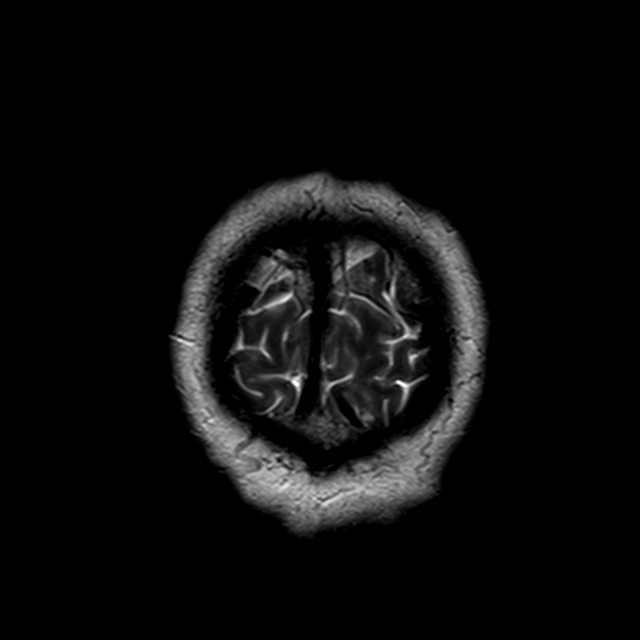

[43 of 48 positions shown; findings below may reference images not displayed]

FINDINGS: Brain: Small focus of acute/subacute ischemia in the dorsal right
pons. No other area of acute ischemia. No acute or chronic
hemorrhage. Normal white matter signal, parenchymal volume and CSF
spaces. The midline structures are normal.

Vascular: Major flow voids are preserved.

Skull and upper cervical spine: Normal calvarium and skull base.
Visualized upper cervical spine and soft tissues are normal.

Sinuses/Orbits:No paranasal sinus fluid levels or advanced mucosal
thickening. No mastoid or middle ear effusion. Normal orbits.
IMPRESSION: Small focus of acute/subacute ischemia in the dorsal right pons. No
hemorrhage or mass effect.

## 2021-05-02 IMAGING — CT CT HEAD W/O CM
4 series · 15 of 47 positions shown, 17 images · non-contrast
Comparison: None.

CLINICAL DATA: Numbness to the left side of face and arm

EXAM:
CT HEAD WITHOUT CONTRAST
TECHNIQUE: Contiguous axial images were obtained from the base of the skull
through the vertex without intravenous contrast.

[Series 3: head without · axial · non-contrast · 0.45mm/px · z∈[-81,+59]mm · 7 of 38 slices shown, 9 images]
[im 5/38  brain]
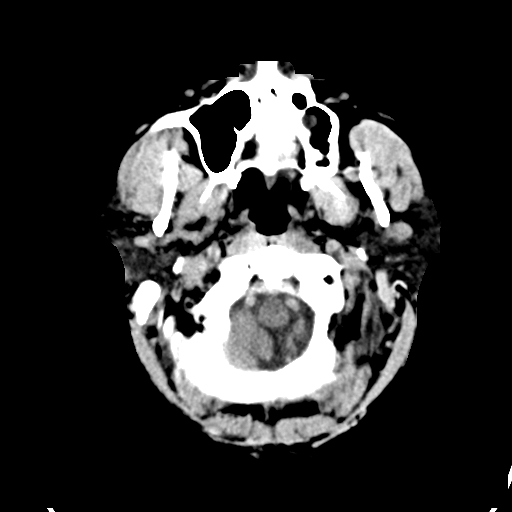
[im 5/38  bone]
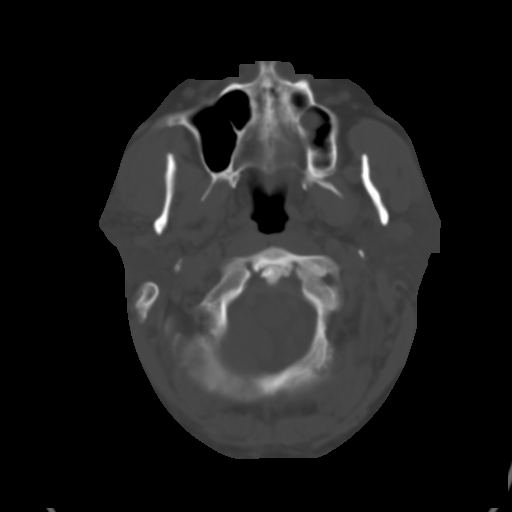
[im 10/38  brain]
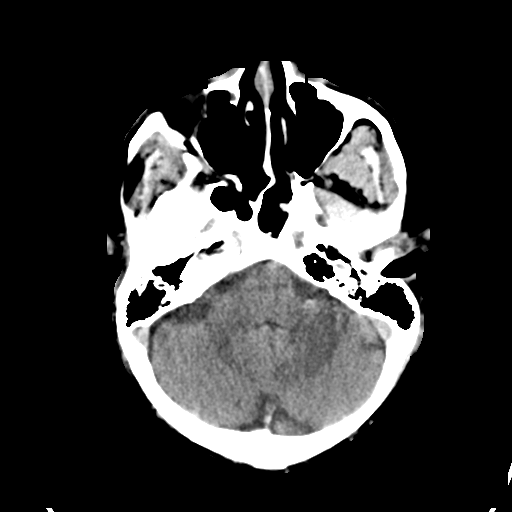
[im 14/38  brain]
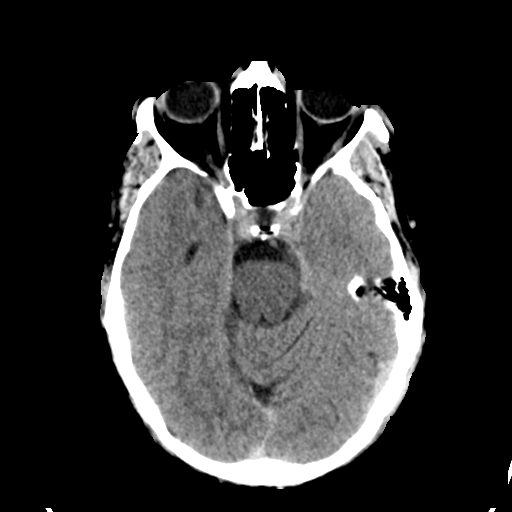
[im 19/38  brain]
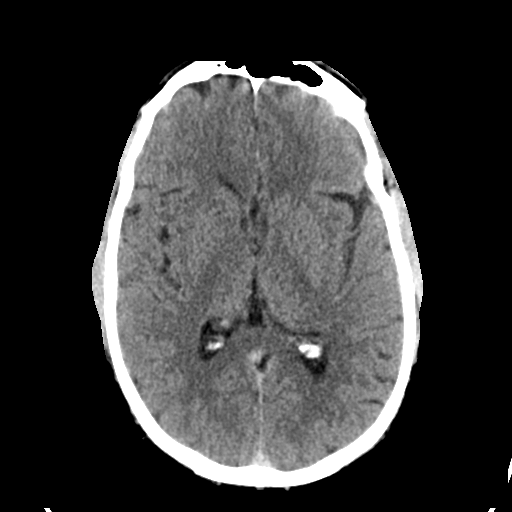
[im 24/38  brain]
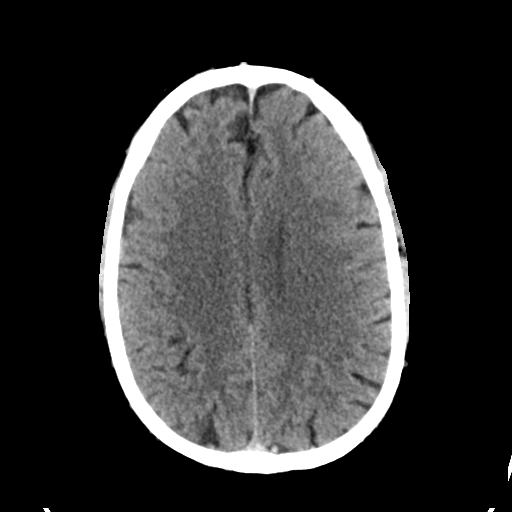
[im 24/38  bone]
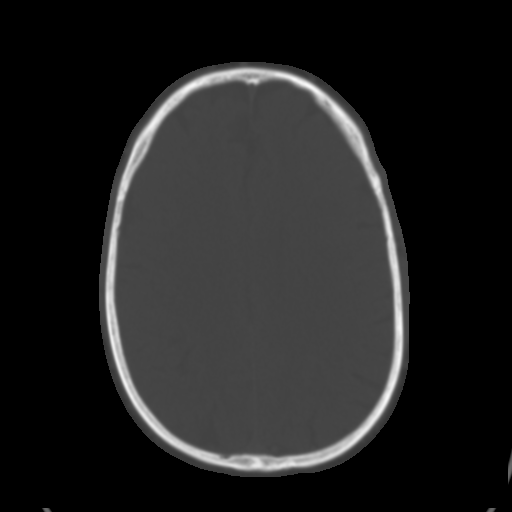
[im 28/38  brain]
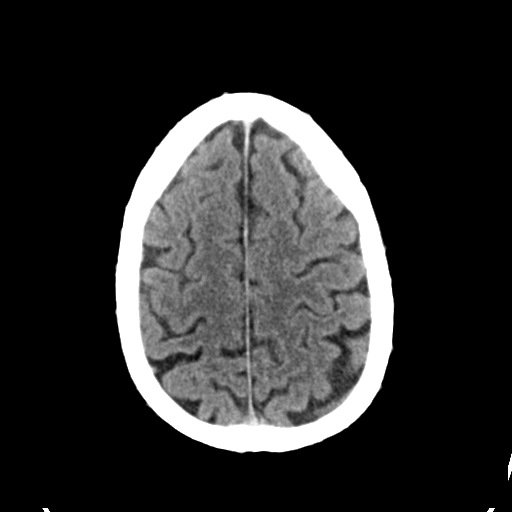
[im 33/38  brain]
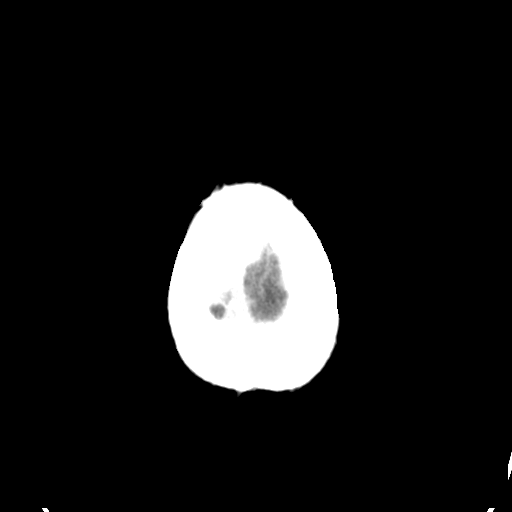

[Series 4: head bone · axial · 0.45mm/px · z∈[-83,-65]mm · 2 of 93 slices shown]
[im 10/93  bone]
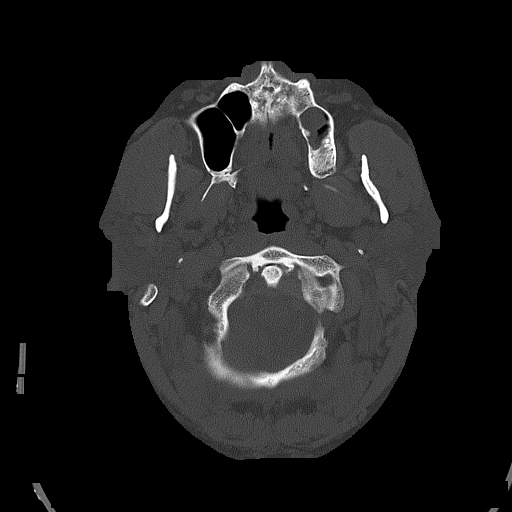
[im 19/93  bone]
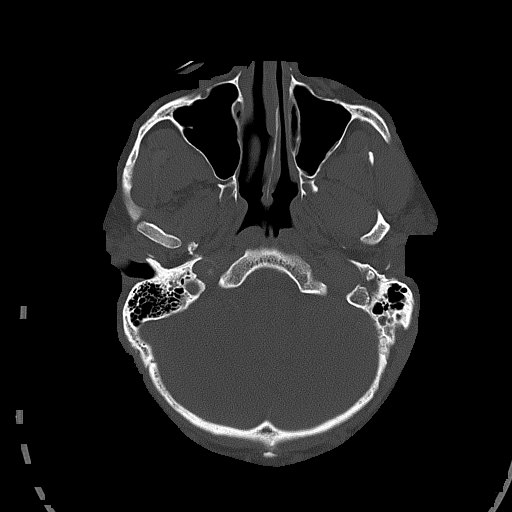

[Series 5: head without cor · coronal · non-contrast · 0.36mm/px · 3 of 78 slices shown]
[im 26/78  brain]
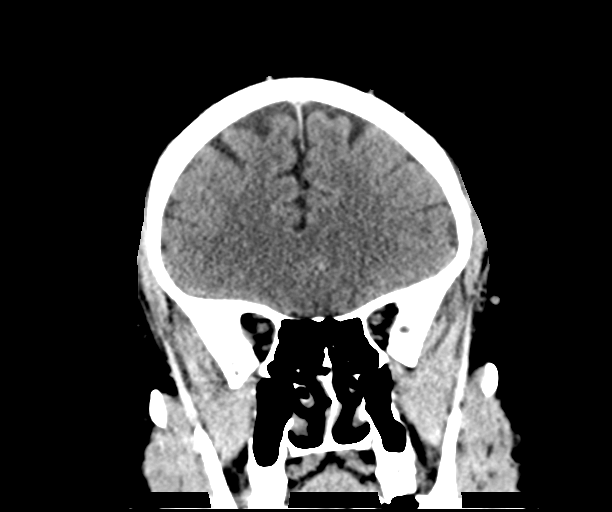
[im 35/78  brain]
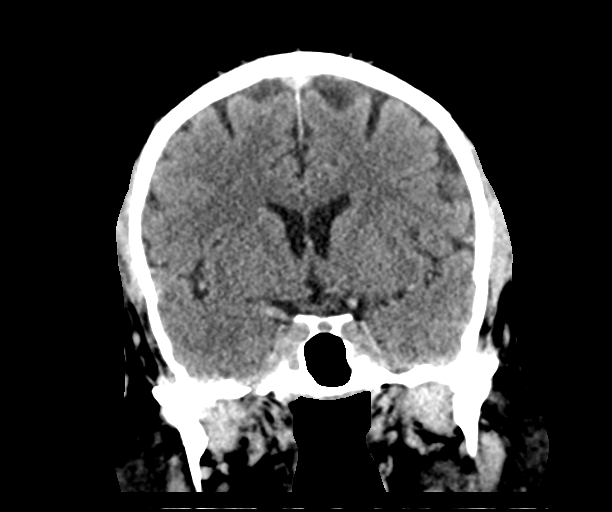
[im 43/78  brain]
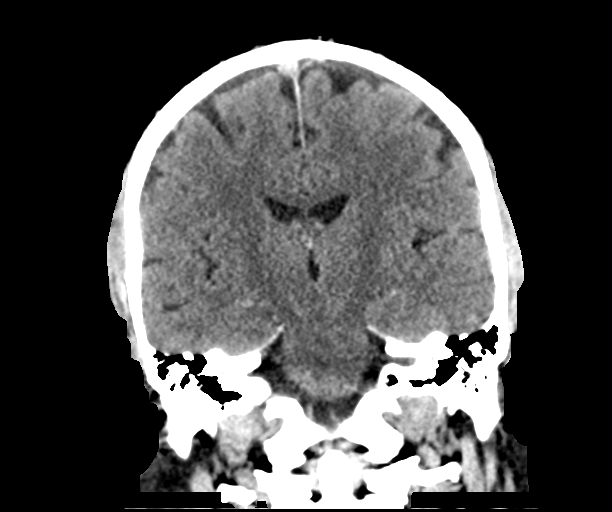

[Series 6: head without sag · sagittal · non-contrast · 0.36mm/px · 3 of 67 slices shown]
[im 23/67  brain]
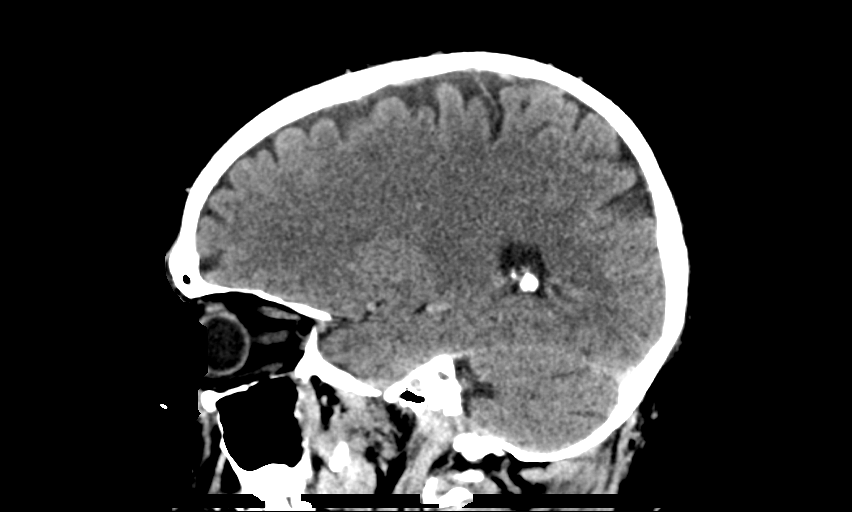
[im 34/67  brain]
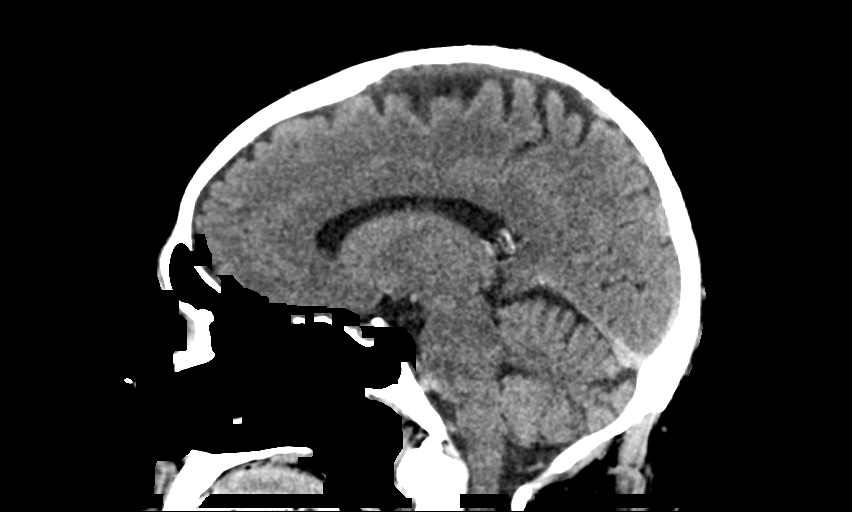
[im 45/67  brain]
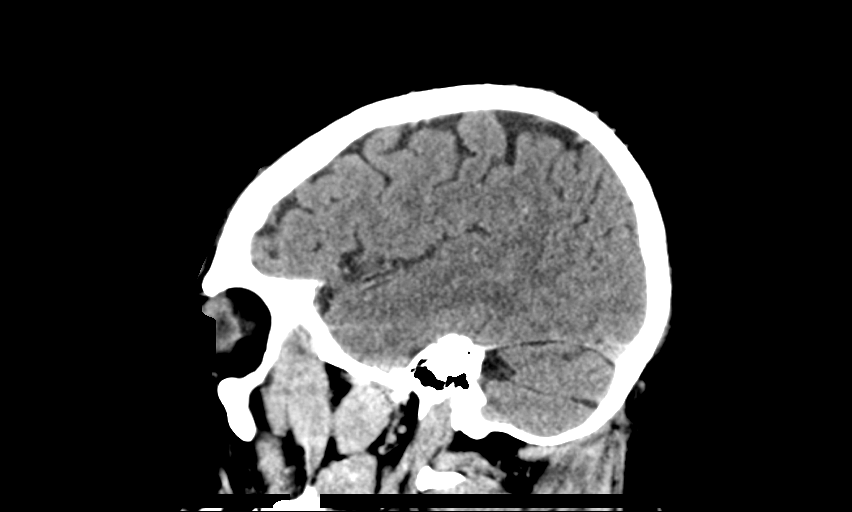

[15 of 47 positions shown; findings below may reference images not displayed]

FINDINGS: Brain: No evidence of acute infarction, hemorrhage, hydrocephalus,
extra-axial collection or mass lesion/mass effect.

Vascular: No hyperdense vessel or unexpected calcification.

Skull: Normal. Negative for fracture or focal lesion.

Sinuses/Orbits: Mucosal thickening in the sinuses

Other: None
IMPRESSION: Negative non contrasted CT appearance of the brain

## 2021-05-02 MED ORDER — SODIUM CHLORIDE 0.9% FLUSH: 3.0000 mL | Freq: Once | INTRAVENOUS | Status: DC

## 2021-05-02 MED ORDER — ACETAMINOPHEN 650 MG RE SUPP: 650.0000 mg | RECTAL | Status: DC | PRN

## 2021-05-02 MED ORDER — ACETAMINOPHEN 325 MG PO TABS: 650.0000 mg | ORAL_TABLET | ORAL | Status: DC | PRN

## 2021-05-02 MED ORDER — STROKE: EARLY STAGES OF RECOVERY BOOK: Freq: Once | Status: DC

## 2021-05-02 MED ORDER — ACETAMINOPHEN 160 MG/5ML PO SOLN: 650.0000 mg | ORAL | Status: DC | PRN

## 2021-05-02 MED ORDER — CLOPIDOGREL BISULFATE 75 MG PO TABS
75.0000 mg | ORAL_TABLET | Freq: Every day | ORAL | Status: DC
  Administered 2021-05-03 – 2021-05-04 (×2): 75 mg via ORAL
  Filled 2021-05-02 (×2): qty 1

## 2021-05-02 MED ORDER — ENOXAPARIN SODIUM 40 MG/0.4ML IJ SOSY
40.0000 mg | PREFILLED_SYRINGE | Freq: Every day | INTRAMUSCULAR | Status: DC
  Administered 2021-05-03: 40 mg via SUBCUTANEOUS
  Filled 2021-05-02: qty 0.4

## 2021-05-02 MED ORDER — ASPIRIN EC 81 MG PO TBEC
81.0000 mg | DELAYED_RELEASE_TABLET | Freq: Every day | ORAL | Status: DC
  Administered 2021-05-03 – 2021-05-04 (×2): 81 mg via ORAL
  Filled 2021-05-02 (×2): qty 1

## 2021-05-02 NOTE — H&P (Signed)
History and Physical    Cameron Herrera [id number] DOB: 1965-07-28 DOA: 05/02/2021  PCP: Patient, No Pcp Per (Inactive)  Patient coming from: Home  I have personally briefly reviewed patient's old medical records in [place]  Chief Complaint: Numbness  HPI: Cameron Herrera is a 56 y.o. male with medical history significant of HTN, former smoker.  Pt presents to ED with c/o numbness.  Onset while laying on couch around 3pm.  Symptoms constant, persistent.  Left side feels "heavy"  No PMH of stroke.  Presents to ED for persistent symptoms.  No fevers, chills, blurry vision, trouble speaking.  ED Course: Small acute pontine stroke on MRI.   Review of Systems: As per HPI, otherwise all review of systems negative.  Past Medical History:  Diagnosis Date   HTN (hypertension)     History reviewed. No pertinent surgical history.   reports that he quit smoking about 5 years ago. His smoking use included cigarettes. His smokeless tobacco use includes chew. He reports current alcohol use. He reports that he does not use drugs.  No Known Allergies  Family History  Problem Relation Age of Onset   COPD Father    Diabetes Father    Hypertension Father    Hypertension Brother      Prior to Admission medications   Medication Sig Start Date End Date Taking? Authorizing Provider  Multiple Vitamin (MULTIVITAMIN) tablet Take 1 tablet by mouth daily.   Yes [provider]  cyclobenzaprine (FLEXERIL) 5 MG tablet Take 1 tablet (5 mg total) by mouth 3 (three) times daily as needed for muscle spasms. Patient not taking: Reported on 05/02/2021 04/03/20   Cheyenne Adas, NP  lisinopril (ZESTRIL) 20 MG tablet Take 1 tablet (20 mg total) by mouth daily. Patient not taking: Reported on 05/02/2021 04/03/20   Cheyenne Adas, NP    Physical Exam: Vitals:   05/02/21 1951 05/02/21 2019 05/02/21 2200 05/02/21 2315  BP: (!) 207/124  (!) 165/108 (!) 178/103  Pulse: (!) 107  86 82  Resp:    19 19  Temp:      SpO2: 99% 99% 95% 96%  Height:  5\' 11"  (1.803 m)      Constitutional: NAD, calm, comfortable Eyes: PERRL, lids and conjunctivae normal ENMT: Mucous membranes are moist. Posterior pharynx clear of any exudate or lesions.Normal dentition.  Neck: normal, supple, no masses, no thyromegaly Respiratory: clear to auscultation bilaterally, no wheezing, no crackles. Normal respiratory effort. No accessory muscle use.  Cardiovascular: Regular rate and rhythm, no murmurs / rubs / gallops. No extremity edema. 2+ pedal pulses. No carotid bruits.  Abdomen: no tenderness, no masses palpated. No hepatosplenomegaly. Bowel sounds positive.  Musculoskeletal: no clubbing / cyanosis. No joint deformity upper and lower extremities. Good ROM, no contractures. Normal muscle tone.  Skin: no rashes, lesions, ulcers. No induration Neurologic: Abnormal finger to nose on L.  Decreased sensation LUE and LLE Psychiatric: Normal judgment and insight. Alert and oriented x 3. Normal mood.    Labs on Admission: I have personally reviewed following labs and imaging studies  CBC: Recent Labs  Lab 05/02/21 2030 05/02/21 2038  WBC 9.1  --   NEUTROABS 5.9  --   HGB 18.0* 17.7*  HCT 51.0 52.0  MCV 94.1  --   PLT 240  --    Basic Metabolic Panel: Recent Labs  Lab 05/02/21 2030 05/02/21 2038  NA 140 138  K 3.7 3.8  CL 103 101  CO2 26  --  GLUCOSE 104* 103*  BUN 12 15  CREATININE 0.98 0.90  CALCIUM 9.1  --    GFR: CrCl cannot be calculated (Unknown ideal weight.). Liver Function Tests: Recent Labs  Lab 05/02/21 2030  AST 25  ALT 34  ALKPHOS 67  BILITOT 1.5*  PROT 7.3  ALBUMIN 4.4   No results for input(s): LIPASE, AMYLASE in the last 168 hours. No results for input(s): AMMONIA in the last 168 hours. Coagulation Profile: Recent Labs  Lab 05/02/21 2030  INR 1.0   Cardiac Enzymes: No results for input(s): CKTOTAL, CKMB, CKMBINDEX, TROPONINI in the last 168 hours. BNP (last 3  results) No results for input(s): PROBNP in the last 8760 hours. HbA1C: No results for input(s): HGBA1C in the last 72 hours. CBG: No results for input(s): GLUCAP in the last 168 hours. Lipid Profile: No results for input(s): CHOL, HDL, LDLCALC, TRIG, CHOLHDL, LDLDIRECT in the last 72 hours. Thyroid Function Tests: No results for input(s): TSH, T4TOTAL, FREET4, T3FREE, THYROIDAB in the last 72 hours. Anemia Panel: No results for input(s): VITAMINB12, FOLATE, FERRITIN, TIBC, IRON, RETICCTPCT in the last 72 hours. Urine analysis: No results found for: COLORURINE, APPEARANCEUR, LABSPEC, PHURINE, GLUCOSEU, HGBUR, BILIRUBINUR, KETONESUR, PROTEINUR, UROBILINOGEN, NITRITE, LEUKOCYTESUR  Radiological Exams on Admission: CT HEAD WO CONTRAST  Result Date: 05/02/2021 CLINICAL DATA:  Numbness to the left side of face and arm EXAM: CT HEAD WITHOUT CONTRAST TECHNIQUE: Contiguous axial images were obtained from the base of the skull through the vertex without intravenous contrast. COMPARISON:  None. FINDINGS: Brain: No evidence of acute infarction, hemorrhage, hydrocephalus, extra-axial collection or mass lesion/mass effect. Vascular: No hyperdense vessel or unexpected calcification. Skull: Normal. Negative for fracture or focal lesion. Sinuses/Orbits: Mucosal thickening in the sinuses Other: None IMPRESSION: Negative non contrasted CT appearance of the brain Electronically Signed   By: Donavan Foil M.D.   On: 05/02/2021 20:44   MR BRAIN WO CONTRAST  Result Date: 05/02/2021 CLINICAL DATA:  Left facial numbness EXAM: MRI HEAD WITHOUT CONTRAST TECHNIQUE: Multiplanar, multiecho pulse sequences of the brain and surrounding structures were obtained without intravenous contrast. COMPARISON:  None. FINDINGS: Brain: Small focus of acute/subacute ischemia in the dorsal right pons. No other area of acute ischemia. No acute or chronic hemorrhage. Normal white matter signal, parenchymal volume and CSF spaces. The midline  structures are normal. Vascular: Major flow voids are preserved. Skull and upper cervical spine: Normal calvarium and skull base. Visualized upper cervical spine and soft tissues are normal. Sinuses/Orbits:No paranasal sinus fluid levels or advanced mucosal thickening. No mastoid or middle ear effusion. Normal orbits. IMPRESSION: Small focus of acute/subacute ischemia in the dorsal right pons. No hemorrhage or mass effect. Electronically Signed   By: Ulyses Jarred M.D.   On: 05/02/2021 23:22    EKG: Independently reviewed.  Assessment/Plan Principal Problem:   Acute ischemic stroke [facility]) Active Problems:   Essential hypertension   Alcohol use    Acute ischemic stroke - Stroke pathway Neuro consult 2d echo Tele monitor ASA 81 daily Plavix 75 for 21 days MRA head and neck ordered PT/OT/SLP Check A1C and FLP HTN - Allowing permissive HTN for acute stroke Pt not on chronic HTN meds because he hasnt followed up with doctor EtOH use / abuse - Numbness from stroke, however neuro thinks the ataxia is more likely related to chronic EtOH use / abuse. In addition to the 3-4 beers daily he admitted to before, now also admits to vodka use. CIWA  DVT prophylaxis: Lovenox Code Status:  Full Family Communication: No family at bedside Disposition Plan: Home after stroke work up [employer] called: Neurology Admission status: Admit to inpatient  Severity of Illness: The appropriate patient status for this patient is INPATIENT. Inpatient status is judged to be reasonable and necessary in order to provide the required intensity of service to ensure the patient's safety. The patient's presenting symptoms, physical exam findings, and initial radiographic and laboratory data in the context of their chronic comorbidities is felt to place them at high risk for further clinical deterioration. Furthermore, it is not anticipated that the patient will be medically stable for discharge from the hospital within 2  midnights of admission.   * I certify that at the point of admission it is my clinical judgment that the patient will require inpatient hospital care spanning beyond 2 midnights from the point of admission due to high intensity of service, high risk for further deterioration and high frequency of surveillance required.*   ,  M. DO [facility]  How to contact the [facility] Attending or Consulting provider [place] or covering provider during after hours [place], for this patient?  Check the care team in [facility] and look for a) attending/consulting [facility] provider listed and b) the [facility] team listed Log into [email or web address]  Amion Physician Scheduling and messaging for groups and whole hospitals  On call and physician scheduling software for group practices, residents, hospitalists and other medical providers for call, clinic, rotation and shift schedules. On[employer] is a hospital-wide system for scheduling doctors and paging doctors on call. EasyPlot is for scientific plotting and data analysis.  [email or web address]  and use [place]'s universal password to access. If you do not have the password, please contact the hospital operator.  Locate the [facility] provider you are looking for under [facility] and page to a number that you can be directly reached. If you still have difficulty reaching the provider, please page the [facility] (Director on Call) for the Hospitalists listed on amion for assistance.  05/02/2021, 11:38 PM

## 2021-05-02 NOTE — ED Notes (Signed)
Patient transported to MRI 

## 2021-05-02 NOTE — ED Provider Notes (Signed)
[facility] EMERGENCY DEPARTMENT Provider Note   CSN: [id number] Arrival date & time: 05/02/21  1945     History  Chief Complaint  Patient presents with   Numbness    Cameron Herrera is a 56 y.o. male history of hypertension but is not on meds because he has not followed up with any doctor, here presenting with numbness.  Patient states that he was on his couch around 3 PM and he has a cute onset of left-sided numbness.  Patient states that his left side feels heavy and felt like he had numbing medicine to the left side.  Patient denies any trouble speaking.  Denies a history of stroke in the past.  Denies any blurry vision or loss of vision.  Denies a history of stroke in the past  The history is provided by the patient.      Home Medications Prior to Admission medications   Medication Sig Start Date End Date Taking? Authorizing Provider  Multiple Vitamin (MULTIVITAMIN) tablet Take 1 tablet by mouth daily.   Yes [provider]  cyclobenzaprine (FLEXERIL) 5 MG tablet Take 1 tablet (5 mg total) by mouth 3 (three) times daily as needed for muscle spasms. Patient not taking: Reported on 05/02/2021 04/03/20   Cheyenne Adas, NP  lisinopril (ZESTRIL) 20 MG tablet Take 1 tablet (20 mg total) by mouth daily. Patient not taking: Reported on 05/02/2021 04/03/20   Cheyenne Adas, NP      Allergies    Patient has no known allergies.    Review of Systems   Review of Systems  Neurological:  Positive for numbness.  All other systems reviewed and are negative.  Physical Exam Updated Vital Signs BP (!) 165/108    Pulse 86    Temp 98.5 F (36.9 C)    Resp 19    Ht 5\' 11"  (1.803 m)    SpO2 95%    BMI 27.89 kg/m  Physical Exam Vitals and nursing note reviewed.  Constitutional:      Appearance: Normal appearance.  HENT:     Head: Normocephalic.     Nose: Nose normal.     Mouth/Throat:     Mouth: Mucous membranes are moist.  Eyes:     Extraocular Movements:  Extraocular movements intact.     Pupils: Pupils are equal, round, and reactive to light.  Cardiovascular:     Rate and Rhythm: Normal rate and regular rhythm.     Pulses: Normal pulses.     Heart sounds: Normal heart sounds.  Pulmonary:     Effort: Pulmonary effort is normal.     Breath sounds: Normal breath sounds.  Abdominal:     General: Abdomen is flat.     Palpations: Abdomen is soft.  Musculoskeletal:        General: Normal range of motion.     Cervical back: Normal range of motion and neck supple.  Skin:    General: Skin is warm.     Capillary Refill: Capillary refill takes less than 2 seconds.  Neurological:     General: No focal deficit present.     Mental Status: He is alert and oriented to person, place, and time.     Comments: No obvious facial droop.  Patient does have decreased sensation in the left arm and leg.  Patient does have abnormal finger-to-nose on the left arm. No pronator drift   Psychiatric:        Mood and Affect: Mood normal.  Behavior: Behavior normal.    ED Results / Procedures / Treatments   Labs (all labs ordered are listed, but only abnormal results are displayed) Labs Reviewed  CBC - Abnormal; Notable for the following components:      Result Value   Hemoglobin 18.0 (*)    All other components within normal limits  COMPREHENSIVE METABOLIC PANEL - Abnormal; Notable for the following components:   Glucose, Bld 104 (*)    Total Bilirubin 1.5 (*)    All other components within normal limits  I-STAT CHEM 8, ED - Abnormal; Notable for the following components:   Glucose, Bld 103 (*)    Calcium, Ion 1.11 (*)    Hemoglobin 17.7 (*)    All other components within normal limits  PROTIME-INR  APTT  DIFFERENTIAL  CBG MONITORING, ED    EKG None  Radiology CT HEAD WO CONTRAST  Result Date: 05/02/2021 CLINICAL DATA:  Numbness to the left side of face and arm EXAM: CT HEAD WITHOUT CONTRAST TECHNIQUE: Contiguous axial images were obtained  from the base of the skull through the vertex without intravenous contrast. COMPARISON:  None. FINDINGS: Brain: No evidence of acute infarction, hemorrhage, hydrocephalus, extra-axial collection or mass lesion/mass effect. Vascular: No hyperdense vessel or unexpected calcification. Skull: Normal. Negative for fracture or focal lesion. Sinuses/Orbits: Mucosal thickening in the sinuses Other: None IMPRESSION: Negative non contrasted CT appearance of the brain Electronically Signed   By: Donavan Foil M.D.   On: 05/02/2021 20:44    Procedures Procedures    Medications Ordered in ED Medications  sodium chloride flush (NS) 0.9 % injection 3 mL (has no administration in time range)    ED Course/ Medical Decision Making/ A&P                           Medical Decision Making [name] is a 56 y.o. male here presenting with left sided numbness that is acute onset around 3 PM. Patient is hypertensive.  Consider head bleed versus ischemic stroke.  Discussed discussed case with Dr. Lavone Neri.  Since patient does not meet LVO criteria, he recommend getting CT head noncontrast and MRI brain.   11:35 PM MRI was pending at signout and shortly after signout, MRI showed right pons stroke.  I updated patient.  Dr. Roxanne Mins updated neurology and patient will be admitted to the hospital service  Amount and/or Complexity of Data Reviewed Independent Historian: friend External Data Reviewed: labs. Labs: ordered. Decision-making details documented in ED Course. Radiology: ordered and independent interpretation performed. ECG/medicine tests: ordered and independent interpretation performed.    Final Clinical Impression(s) / ED Diagnoses Final diagnoses:  None    Rx / DC Orders ED Discharge Orders     None         Drenda Freeze, MD 05/02/21 2336

## 2021-05-02 NOTE — ED Notes (Signed)
Patient transported to CT 

## 2021-05-02 NOTE — ED Triage Notes (Signed)
Pt reported to ED by POV with c/o numbness to left side of face, left arm and left leg. Pt endorses hxx of HTN but does not take medication for it. LKW was at 1300. Pt oriented to time and place, speech is clear and eve with symmetrical facial movements. During stroke screen, pt noted to have noticeable tremor to left arm and leg with exaggerated movements of LLE.

## 2021-05-02 NOTE — ED Provider Triage Note (Signed)
Emergency Medicine Provider Triage Evaluation Note  Cameron Herrera , a 56 y.o. male  was evaluated in triage.  Pt complains of left-sided numbness that has been coming and going since 1 PM today.  Describes this as "when your foot is asleep."  No history of CVA.  Hypertensive and reports that he no longer takes antihypertensives.  Denies any weakness, dizziness or problems with balance.  Review of Systems  Positive: Numbness Negative:   Physical Exam  BP (!) 207/124 (BP Location: Left Arm)    Pulse (!) 107    Temp 98.5 F (36.9 C)    Resp 18    Ht 5\' 11"  (1.803 m)    SpO2 99%    BMI 27.89 kg/m  Gen:   Awake, no distress   Resp:  Normal effort  MSK:   Moves extremities without difficulty  Other:  Ambulatory, alert and oriented.  Sensation intact and equal bilaterally.  5 out of 5 strength and normal range of motion in all 4 extremities.  Mild difficulty with finger-nose on left side.  Medical Decision Making  Medically screening exam initiated at 8:23 PM.  Appropriate orders placed.  Cameron Herrera was informed that the remainder of the evaluation will be completed by another provider, this initial triage assessment does not replace that evaluation, and the importance of remaining in the ED until their evaluation is complete.  Low suspicion CVA, however order set pursued.  No code stroke called.   Rhae Hammock, PA-C 05/02/21 2026

## 2021-05-02 NOTE — ED Provider Notes (Signed)
Care assumed from Dr. Silverio Lay, patient with elevated blood pressure and vague numbness of the left side of the body pending MRI.  Plan is to admit if MRI shows stroke, discharged on antihypertensives if MRI does not show stroke.  MRI does show stroke in the right side of the pons, will need to be admitted.  Case is discussed with Dr. Julian Reil of [facility], who agrees to admit the patient.  Case also discussed with Dr. Derry Lory of neurology service who agrees to see the patient in consultation.  Blood pressure has come down to 165/108 without any interventions, does not need emergent lowering.   Dione Booze, MD 05/02/21 [phone]

## 2021-05-03 ENCOUNTER — Inpatient Hospital Stay (HOSPITAL_COMMUNITY): Payer: 59

## 2021-05-03 ENCOUNTER — Encounter (HOSPITAL_COMMUNITY): Payer: Self-pay | Admitting: Internal Medicine

## 2021-05-03 DIAGNOSIS — Z789 Other specified health status: Secondary | ICD-10-CM | POA: Diagnosis present

## 2021-05-03 DIAGNOSIS — I6389 Other cerebral infarction: Secondary | ICD-10-CM | POA: Diagnosis not present

## 2021-05-03 DIAGNOSIS — I639 Cerebral infarction, unspecified: Secondary | ICD-10-CM

## 2021-05-03 LAB — HEMOGLOBIN A1C
Hgb A1c MFr Bld: 5.2 % (ref 4.8–5.6)
Mean Plasma Glucose: 102.54 mg/dL

## 2021-05-03 LAB — ECHOCARDIOGRAM COMPLETE
AR max vel: 2.8 cm2
AV Area VTI: 2.92 cm2
AV Area mean vel: 2.57 cm2
AV Mean grad: 4 mmHg
AV Peak grad: 8.5 mmHg
Ao pk vel: 1.46 m/s
Area-P 1/2: 2.72 cm2
Calc EF: 49.6 %
Height: 71 in
MV VTI: 3.38 cm2
S' Lateral: 2.8 cm
Single Plane A2C EF: 57.2 %
Single Plane A4C EF: 33.2 %

## 2021-05-03 LAB — HIV ANTIBODY (ROUTINE TESTING W REFLEX): HIV Screen 4th Generation wRfx: NONREACTIVE

## 2021-05-03 LAB — RESP PANEL BY RT-PCR (FLU A&B, COVID) ARPGX2
Influenza A by PCR: NEGATIVE
Influenza B by PCR: NEGATIVE
SARS Coronavirus 2 by RT PCR: NEGATIVE

## 2021-05-03 LAB — LIPID PANEL
Cholesterol: 164 mg/dL (ref 0–200)
HDL: 46 mg/dL (ref >40)
LDL Cholesterol: 100 mg/dL — ABNORMAL HIGH (ref 0–99)
Total CHOL/HDL Ratio: 3.6 RATIO
Triglycerides: 92 mg/dL (ref <150)
VLDL: 18 mg/dL (ref 0–40)

## 2021-05-03 LAB — RAPID URINE DRUG SCREEN, HOSP PERFORMED
Amphetamines: NOT DETECTED
Barbiturates: NOT DETECTED
Benzodiazepines: NOT DETECTED
Cocaine: NOT DETECTED
Opiates: NOT DETECTED
Tetrahydrocannabinol: NOT DETECTED

## 2021-05-03 LAB — FOLATE: Folate: 13.7 ng/mL (ref >5.9)

## 2021-05-03 LAB — VITAMIN B12: Vitamin B-12: 170 pg/mL — ABNORMAL LOW (ref 180–914)

## 2021-05-03 LAB — TSH: TSH: 1.478 u[IU]/mL (ref 0.350–4.500)

## 2021-05-03 IMAGING — MR MR MRA NECK WO/W CM
5 series · 29 of 48 positions shown · IV contrast (gadavist)
Comparison: None.

CLINICAL DATA: Stroke

EXAM:
MRA NECK WITHOUT AND WITH CONTRAST
MRA HEAD WITHOUT CONTRAST
TECHNIQUE: Multiplanar and multiecho pulse sequences of the neck were obtained
without and with intravenous contrast. Angiographic images of the
neck were obtained using MRA technique without and with intravenous
contrast; Angiographic images of the Circle of Willis were obtained
using MRA technique without intravenous contrast.
CONTRAST:  9mL GADAVIST GADOBUTROL 1 MMOL/ML IV SOLN

[Series 8: tof_fl3d_tra_iso · axial · B · 0.6mm · 0.52mm/px · z∈[-164,+33]mm · 9 of 359 slices shown]
[im 15/359]
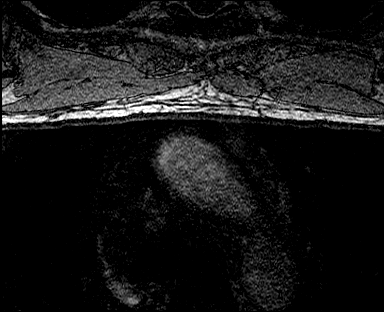
[im 60/359]
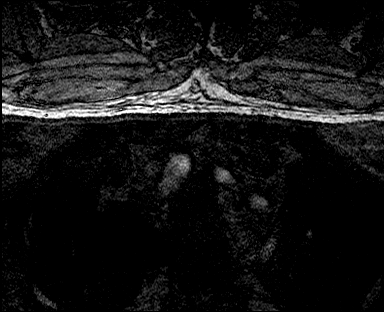
[im 105/359]
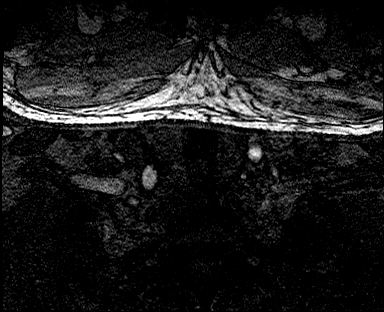
[im 150/359]
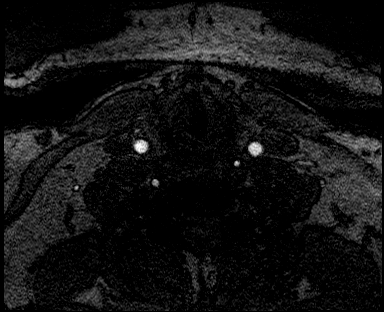
[im 180/359]
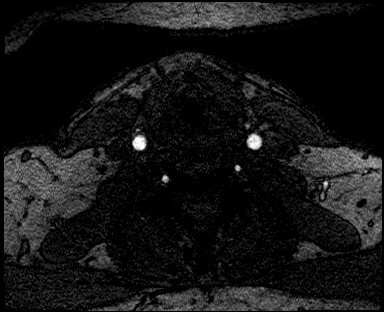
[im 209/359]
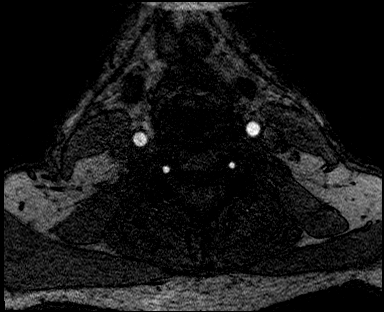
[im 254/359]
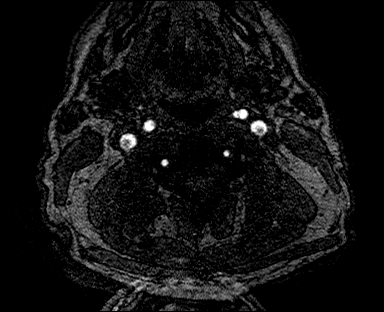
[im 299/359]
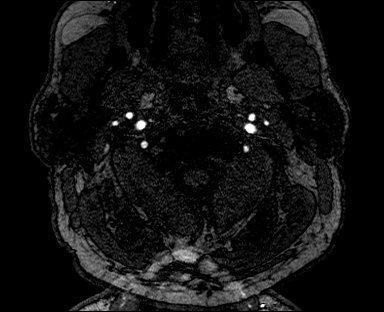
[im 344/359]
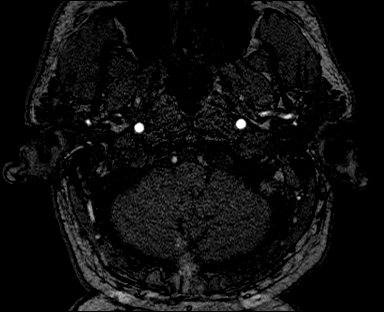

[Series 11: angio_fl3d_cor_highres_pre_ttc=3.0s · coronal · B · 0.9mm · 0.62mm/px · 6 of 80 slices shown]
[im 1/80]
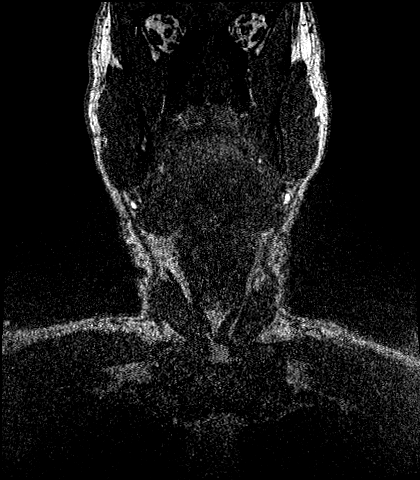
[im 16/80]
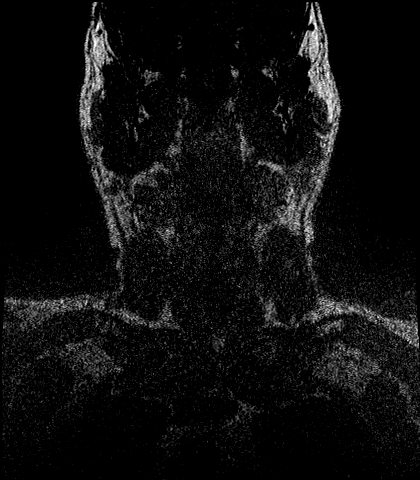
[im 32/80]
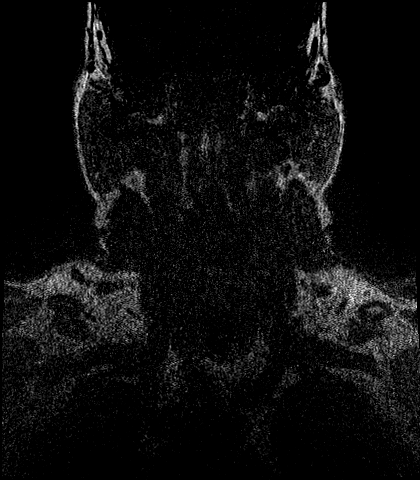
[im 48/80]
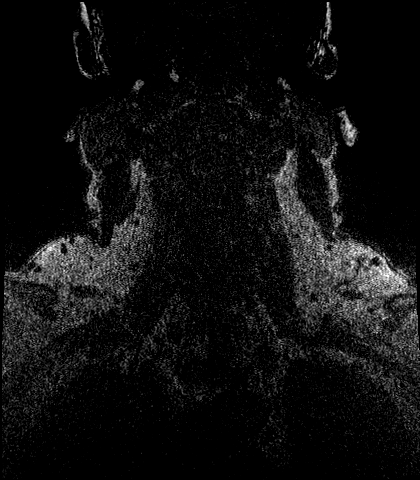
[im 64/80]
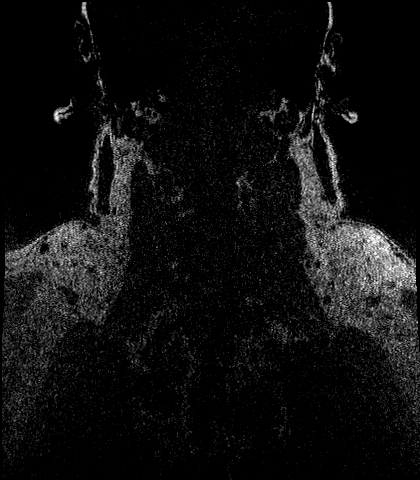
[im 80/80]
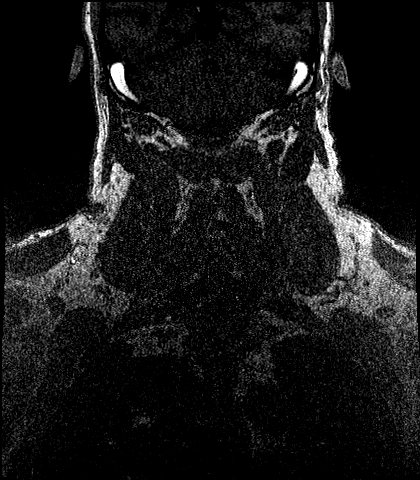

[Series 13: angio_fl3d_cor_highres_post_ttc=3.0s · coronal · B · 0.9mm · 0.62mm/px · 6 of 79 slices shown]
[im 1/79]
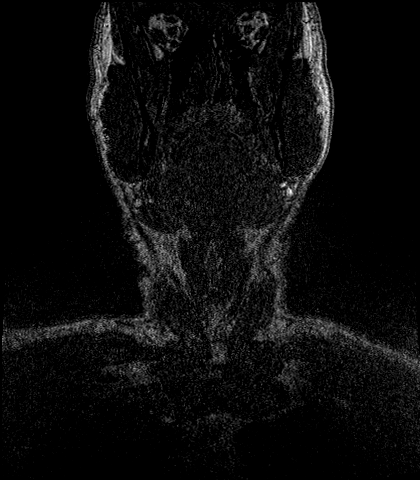
[im 16/79]
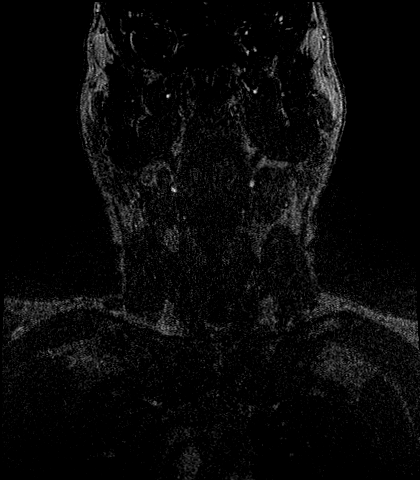
[im 32/79]
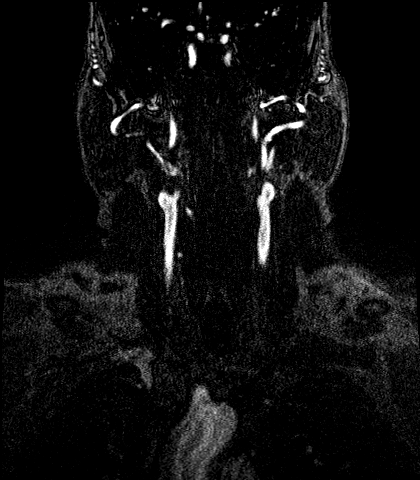
[im 47/79]
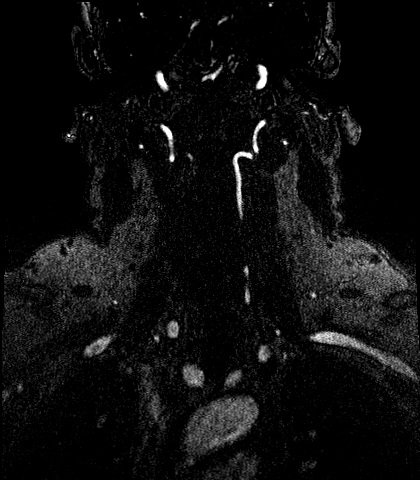
[im 63/79]
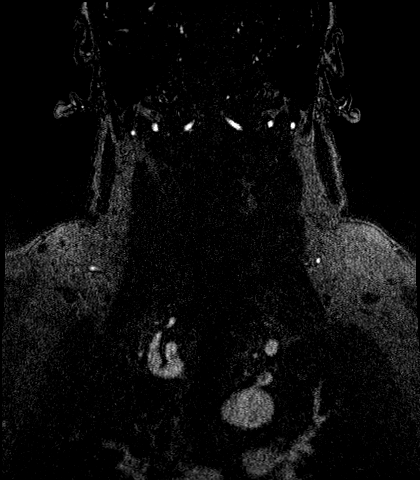
[im 79/79]
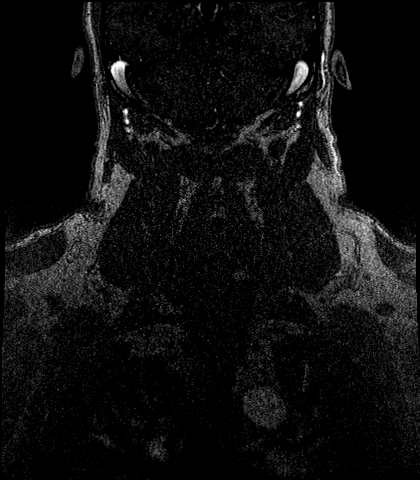

[Series 14: angio_fl3d_cor_highres_post_ttc=3.0s_moco-adv · coronal · B · 0.9mm · 0.62mm/px · 6 of 79 slices shown]
[im 1/79]
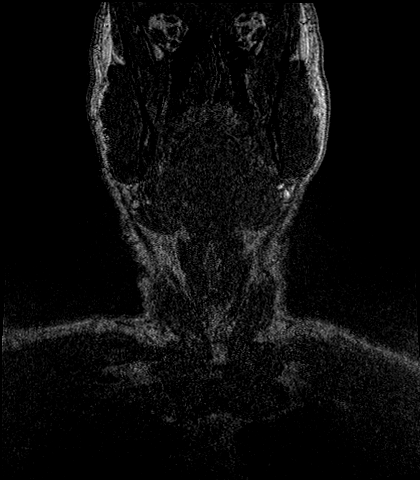
[im 16/79]
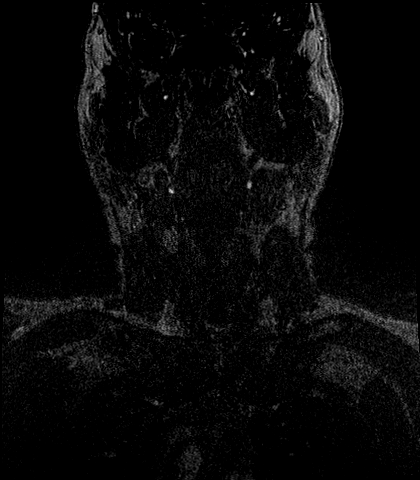
[im 32/79]
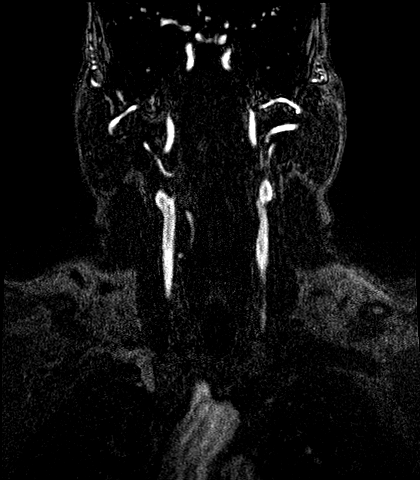
[im 47/79]
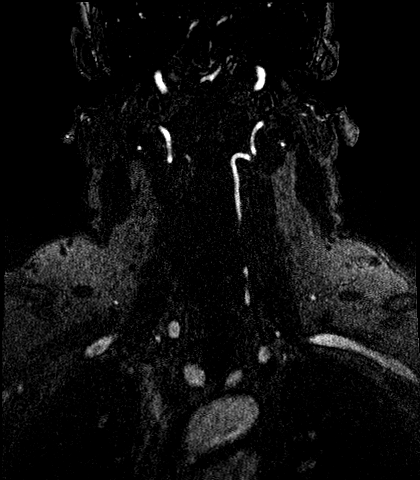
[im 63/79]
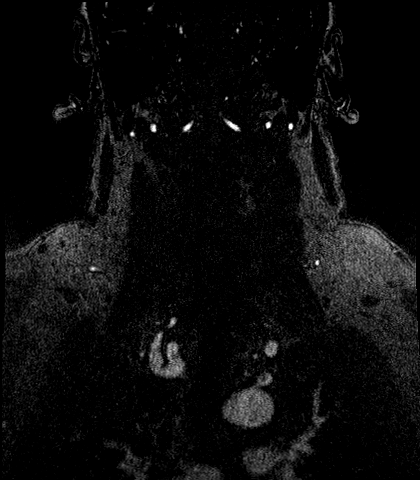
[im 79/79]
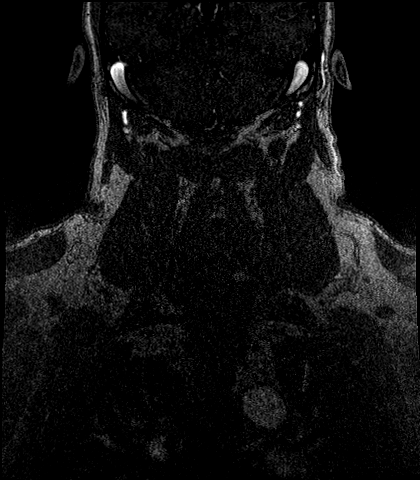

[Series 15: angio_fl3d_cor_highres_post_ttc=3.0s_moco-adv_sub · coronal · B · 0.9mm · 0.62mm/px · 2 of 76 slices shown]
[im 1/76]
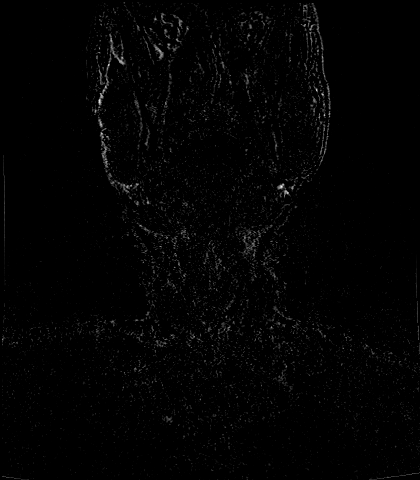
[im 19/76]
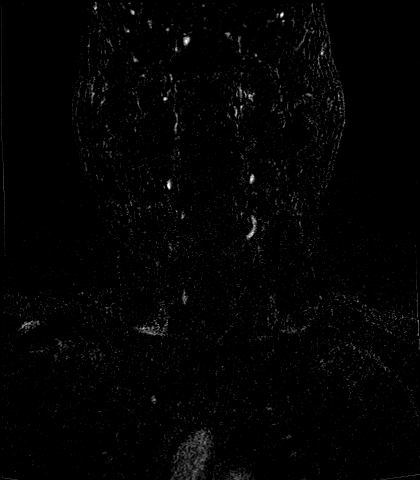

[29 of 48 positions shown; findings below may reference images not displayed]

FINDINGS: MRA NECK FINDINGS

Normal carotid and vertebral arteries. The vertebral system is
codominant. 3 vessel branch pattern of the aortic arch.

MRA HEAD FINDINGS

POSTERIOR CIRCULATION:

--Vertebral arteries: Normal

--Inferior cerebellar arteries: Normal.

--Basilar artery: Normal.

--Superior cerebellar arteries: Normal.

--Posterior cerebral arteries: Normal.

ANTERIOR CIRCULATION:

--Intracranial internal carotid arteries: Normal.

--Anterior cerebral arteries (ACA): Normal.

--Middle cerebral arteries (MCA): Normal.

ANATOMIC VARIANTS: Fetal origin of the right PCA.
IMPRESSION: Normal MRA of the head and neck.

## 2021-05-03 IMAGING — MR MR MRA HEAD W/O CM
1 series · 19 of 48 positions shown · IV contrast (gadavist)
Comparison: None.

CLINICAL DATA: Stroke

EXAM:
MRA NECK WITHOUT AND WITH CONTRAST
MRA HEAD WITHOUT CONTRAST
TECHNIQUE: Multiplanar and multiecho pulse sequences of the neck were obtained
without and with intravenous contrast. Angiographic images of the
neck were obtained using MRA technique without and with intravenous
contrast; Angiographic images of the Circle of Willis were obtained
using MRA technique without intravenous contrast.
CONTRAST:  9mL GADAVIST GADOBUTROL 1 MMOL/ML IV SOLN

[Series 5: 3d cow · axial · 0.5mm · 0.41mm/px · z∈[-71,+10]mm · 19 of 172 slices shown]
[im 1/172]
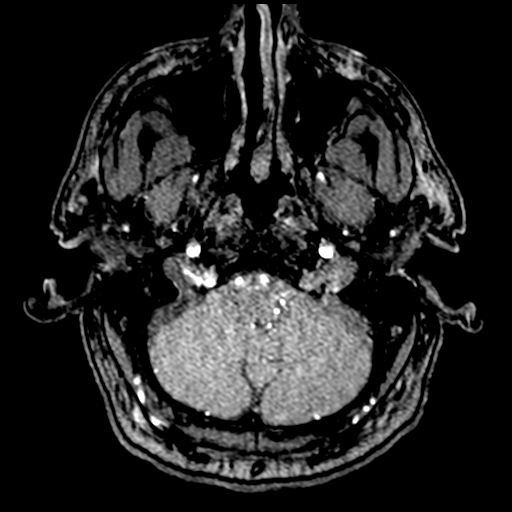
[im 4/172]
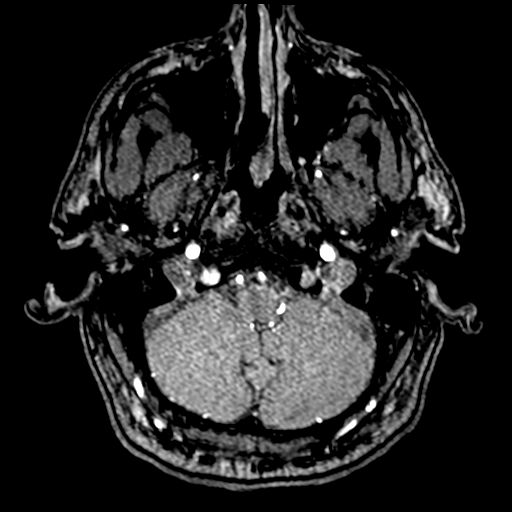
[im 8/172]
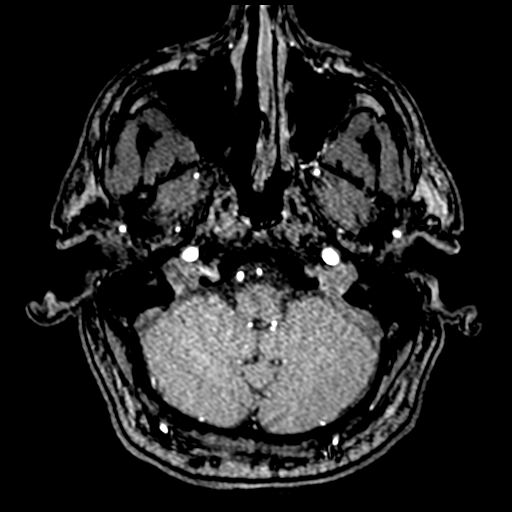
[im 11/172]
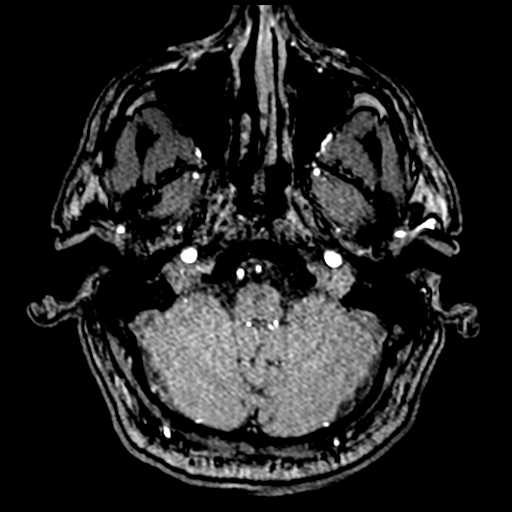
[im 15/172]
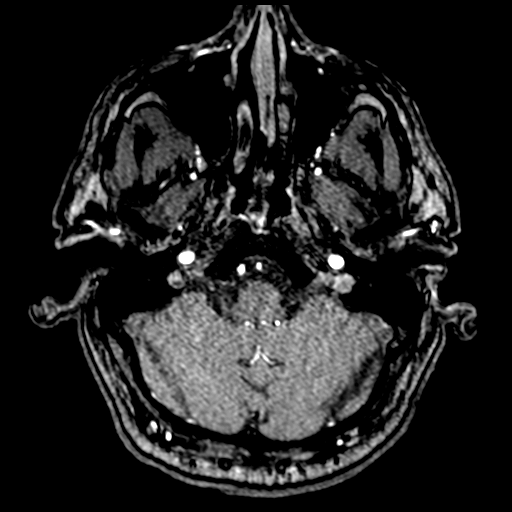
[im 19/172]
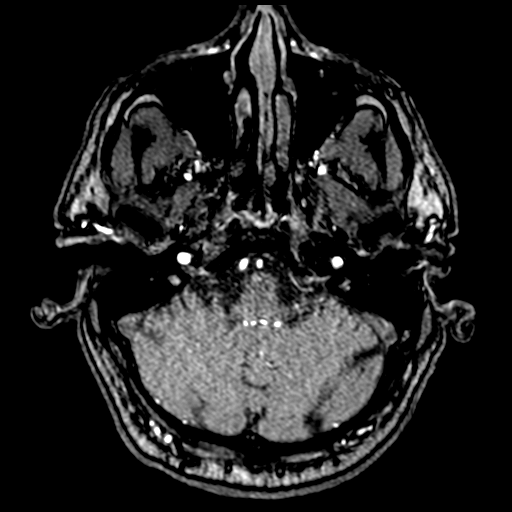
[im 22/172]
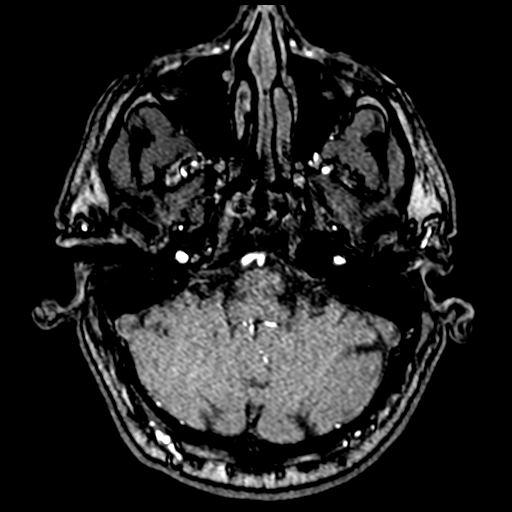
[im 26/172]
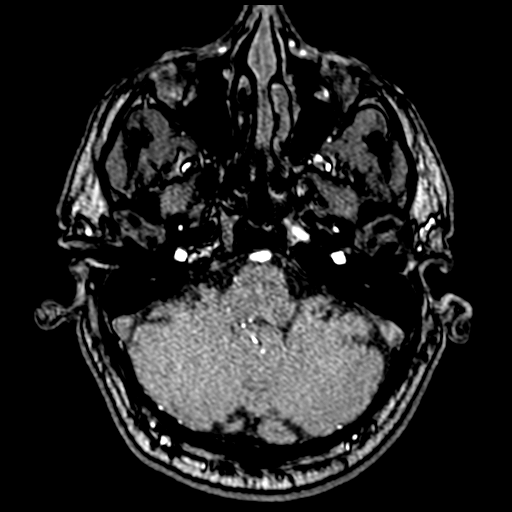
[im 30/172]
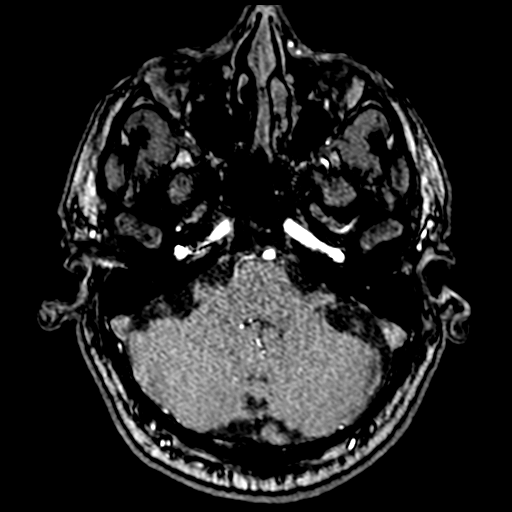
[im 33/172]
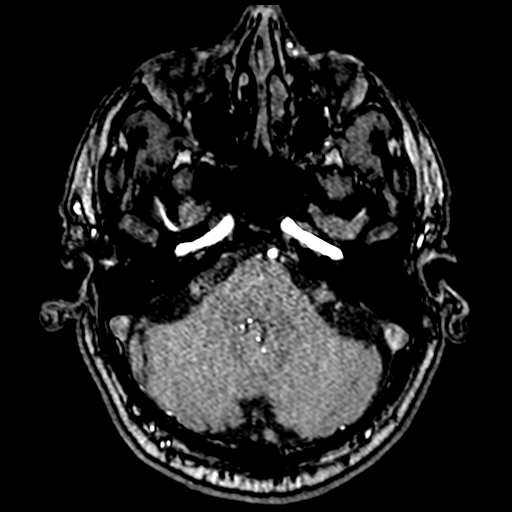
[im 37/172]
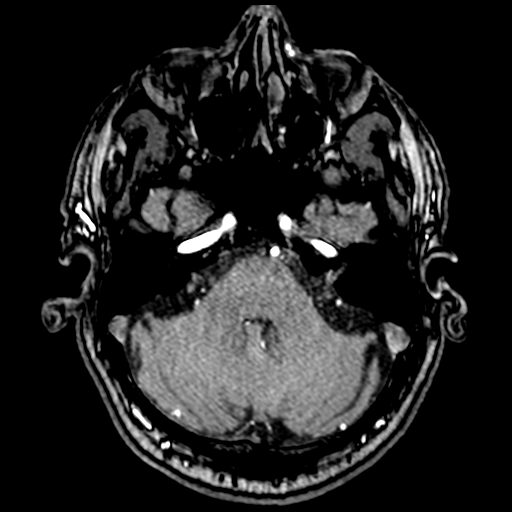
[im 55/172]
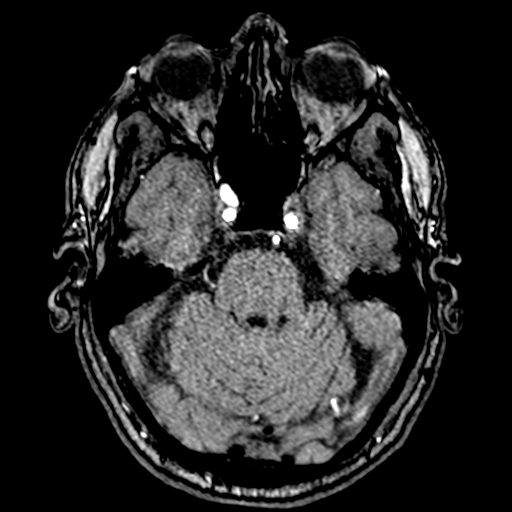
[im 77/172]
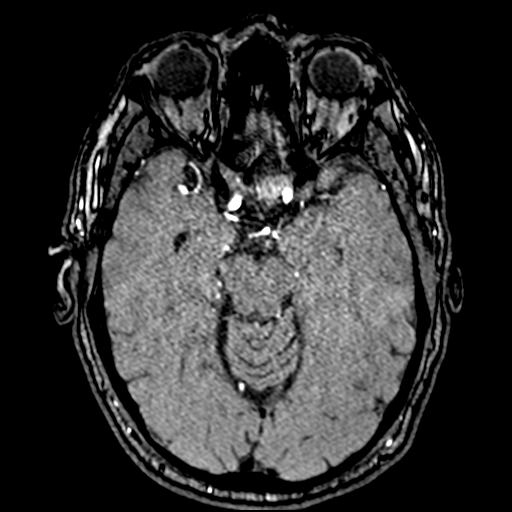
[im 88/172]
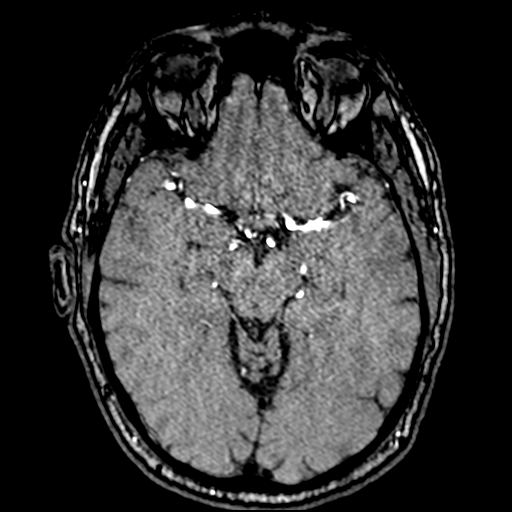
[im 99/172]
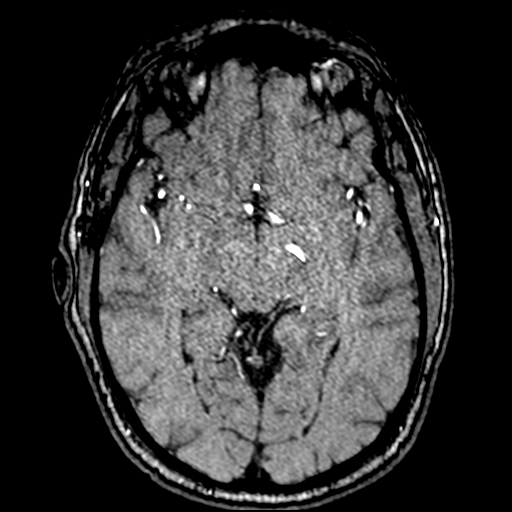
[im 121/172]
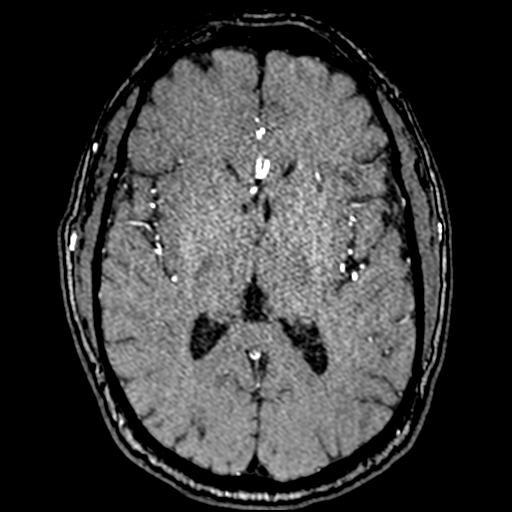
[im 142/172]
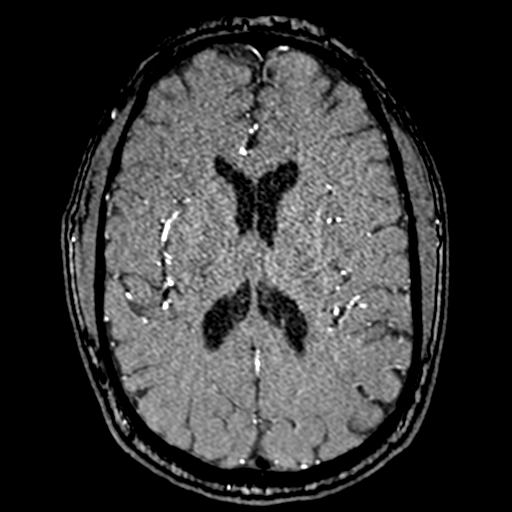
[im 146/172]
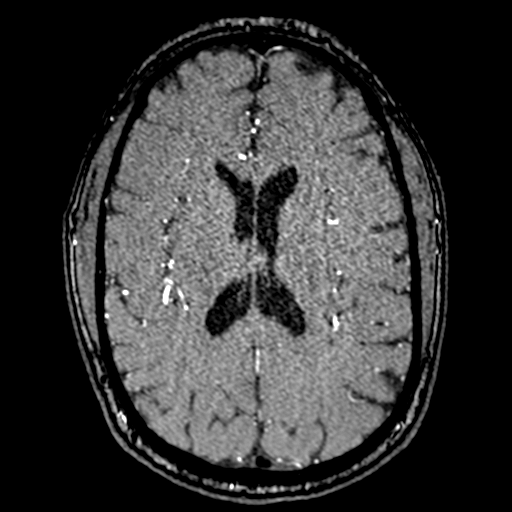
[im 164/172]
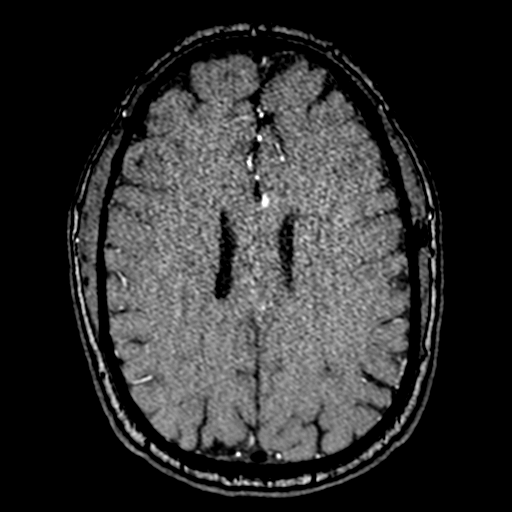

[19 of 48 positions shown; findings below may reference images not displayed]

FINDINGS: MRA NECK FINDINGS

Normal carotid and vertebral arteries. The vertebral system is
codominant. 3 vessel branch pattern of the aortic arch.

MRA HEAD FINDINGS

POSTERIOR CIRCULATION:

--Vertebral arteries: Normal

--Inferior cerebellar arteries: Normal.

--Basilar artery: Normal.

--Superior cerebellar arteries: Normal.

--Posterior cerebral arteries: Normal.

ANTERIOR CIRCULATION:

--Intracranial internal carotid arteries: Normal.

--Anterior cerebral arteries (ACA): Normal.

--Middle cerebral arteries (MCA): Normal.

ANATOMIC VARIANTS: Fetal origin of the right PCA.
IMPRESSION: Normal MRA of the head and neck.

## 2021-05-03 MED ORDER — LORAZEPAM 1 MG PO TABS: 1.0000 mg | ORAL_TABLET | ORAL | Status: DC | PRN

## 2021-05-03 MED ORDER — LORAZEPAM 2 MG/ML IJ SOLN: 1.0000 mg | INTRAMUSCULAR | Status: DC | PRN

## 2021-05-03 MED ORDER — FOLIC ACID 1 MG PO TABS
1.0000 mg | ORAL_TABLET | Freq: Every day | ORAL | Status: DC
  Administered 2021-05-03 – 2021-05-04 (×2): 1 mg via ORAL
  Filled 2021-05-03 (×2): qty 1

## 2021-05-03 MED ORDER — ATORVASTATIN CALCIUM 40 MG PO TABS
40.0000 mg | ORAL_TABLET | Freq: Every day | ORAL | Status: DC
  Administered 2021-05-03 – 2021-05-04 (×2): 40 mg via ORAL
  Filled 2021-05-03 (×2): qty 1

## 2021-05-03 MED ORDER — THIAMINE HCL 100 MG PO TABS: 100.0000 mg | ORAL_TABLET | Freq: Every day | ORAL | Status: DC

## 2021-05-03 MED ORDER — ADULT MULTIVITAMIN W/MINERALS CH
1.0000 | ORAL_TABLET | Freq: Every day | ORAL | Status: DC
  Administered 2021-05-03 – 2021-05-04 (×2): 1 via ORAL
  Filled 2021-05-03 (×2): qty 1

## 2021-05-03 MED ORDER — GADOBUTROL 1 MMOL/ML IV SOLN
9.0000 mL | Freq: Once | INTRAVENOUS | Status: AC | PRN
  Administered 2021-05-03: 9 mL via INTRAVENOUS

## 2021-05-03 MED ORDER — CYANOCOBALAMIN 1000 MCG/ML IJ SOLN
1000.0000 ug | Freq: Every day | INTRAMUSCULAR | Status: DC
  Administered 2021-05-03 – 2021-05-04 (×2): 1000 ug via INTRAMUSCULAR
  Filled 2021-05-03 (×2): qty 1

## 2021-05-03 MED ORDER — THIAMINE HCL 100 MG/ML IJ SOLN
100.0000 mg | Freq: Every day | INTRAMUSCULAR | Status: DC
  Administered 2021-05-03: 100 mg via INTRAVENOUS
  Filled 2021-05-03: qty 2

## 2021-05-03 MED ORDER — THIAMINE HCL 100 MG PO TABS
100.0000 mg | ORAL_TABLET | Freq: Every day | ORAL | Status: DC
  Administered 2021-05-04: 100 mg via ORAL
  Filled 2021-05-03 (×2): qty 1

## 2021-05-03 MED ORDER — THIAMINE HCL 100 MG/ML IJ SOLN: 100.0000 mg | Freq: Every day | INTRAMUSCULAR | Status: DC

## 2021-05-03 NOTE — Evaluation (Signed)
Occupational Therapy Evaluation Patient Details Name: Cameron Herrera MRN: [id number] DOB: Nov 21, 1965 Today's Date: 05/03/2021   History of Present Illness 56 y.o. M admitted on 05/02/21 due to L sided numbness and heaviness. 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 PMH significant for HTN.   Clinical Impression   Pt admitted for concerns above. PTA pt reported that he was independent with all ADL's and IADL's, including working and driving. At this time, pt presents with some mild fine motor deficits in the LUE and some mild visual deficits in the L eye when looking R. Overall, pt is able to complete ADL's and functional mobility with independence. Recommending OP OT to address LUE and Vision, as well as recommending follow up with an eye doctor. Pt may progress to not needing OP OT, acute OT will continue to follow to address concerns.       Recommendations for follow up therapy are one component of a multi-disciplinary discharge planning process, led by the attending physician.  Recommendations may be updated based on patient status, additional functional criteria and insurance authorization.   Follow Up Recommendations  Outpatient OT (Pending pt progress)    Assistance Recommended at Discharge None  Patient can return home with the following      Functional Status Assessment  Patient has had a recent decline in their functional status and demonstrates the ability to make significant improvements in function in a reasonable and predictable amount of time.  Equipment Recommendations  None recommended by OT    Recommendations for Other Services       Precautions / Restrictions Precautions Precautions: None Restrictions Weight Bearing Restrictions: No      Mobility Bed Mobility Overal bed mobility: Modified Independent             General bed mobility comments: No difficulties for ED stretcher    Transfers Overall transfer level: Modified independent Equipment used: None                General transfer comment: No difficulties      Balance Overall balance assessment: No apparent balance deficits (not formally assessed)                                         ADL either performed or assessed with clinical judgement   ADL Overall ADL's : Modified independent                                       General ADL Comments: Pt has some mild LUE numbness/heaviness/incoordination, but it does not affect BADL's     Vision Baseline Vision/History: 0 No visual deficits Ability to See in Adequate Light: 0 Adequate Patient Visual Report: No change from baseline Vision Assessment?: Vision impaired- to be further tested in functional context Additional Comments: All tests appear WFL except when pt looks to the R, his L eye gets blurry/goes double     Perception     Praxis      Pertinent Vitals/Pain Pain Assessment: No/denies pain     Hand Dominance Right   Extremity/Trunk Assessment Upper Extremity Assessment Upper Extremity Assessment: LUE deficits/detail LUE Deficits / Details: Tingling and heaviness LUE Sensation: decreased light touch LUE Coordination: decreased fine motor   Lower Extremity Assessment Lower Extremity Assessment: Defer to PT evaluation  Cervical / Trunk Assessment Cervical / Trunk Assessment: Normal   Communication Communication Communication: No difficulties   Cognition Arousal/Alertness: Awake/alert Behavior During Therapy: WFL for tasks assessed/performed Overall Cognitive Status: Within Functional Limits for tasks assessed                                       General Comments  VSS on RA    Exercises     Shoulder Instructions      Home Living Family/patient expects to be discharged to:: Private residence Living Arrangements: Alone Available Help at Discharge: Family Type of Home: House Home Access: Stairs to enter [employer] of Steps: 4  steps Entrance Stairs-Rails: Can reach both Home Layout: One level     Bathroom Shower/Tub: Chief Strategy Officer: Standard     Home Equipment: None          Prior Functioning/Environment Prior Level of Function : Independent/Modified Independent;Driving;Working/employed                        OT Problem List: Decreased activity tolerance;Impaired sensation;Impaired UE functional use      OT Treatment/Interventions:      OT Goals(Current goals can be found in the care plan section) Acute Rehab OT Goals Patient Stated Goal: To feel normal again OT Goal Formulation: With patient Time For Goal Achievement: 05/03/21 Potential to Achieve Goals: Good  OT Frequency:      Co-evaluation              AM-PAC OT "6 Clicks" Daily Activity     Outcome Measure Help from another person eating meals?: None Help from another person taking care of personal grooming?: None Help from another person toileting, which includes using toliet, bedpan, or urinal?: None Help from another person bathing (including washing, rinsing, drying)?: None Help from another person to put on and taking off regular upper body clothing?: None Help from another person to put on and taking off regular lower body clothing?: None 6 Click Score: 24   End of Session Equipment Utilized During Treatment: Gait belt Nurse Communication: Mobility status  Activity Tolerance: Patient tolerated treatment well Patient left: in bed;with call bell/phone within reach  OT Visit Diagnosis: Muscle weakness (generalized) (M62.81);Hemiplegia and hemiparesis Hemiplegia - Right/Left: Left Hemiplegia - dominant/non-dominant: Non-Dominant Hemiplegia - caused by: Cerebral infarction                Time: 2952-8413 OT Time Calculation (min): 35 min Charges:  OT General Charges $OT Visit: 1 Visit OT Evaluation $OT Eval Moderate Complexity: 1 Mod OT Treatments $Therapeutic Activity: 8-22 mins    H., OTR/L Acute Rehabilitation   [place]  05/03/2021, 5:57 PM

## 2021-05-03 NOTE — Consult Note (Signed)
NEUROLOGY CONSULTATION NOTE   Date of service: May 03, 2021 Patient Name: Cameron Herrera MRN:  [id number] DOB:  June 11, 1965 Reason for consult: "Left-sided numbness" Requesting Provider: Etta Quill, DO _ _ _   _ __   _ __ _ _  __ __   _ __   __ _  History of Present Illness  Cameron Herrera is a 56 y.o. male with PMH significant for hypertension, Etoh use, who presents with acute onset left-sided numbness.  Reports that he was sitting in a recliner when he had sudden onset numbness involving the lower left neck and the back of his head along with left arm and left leg.  He reports that his symptoms would go away for little bit and then come back so he came to the hospital.  No prior history of stroke, no family history of stroke.  Has not seen a doctor in a couple years.  Endorses drinking anywhere between 2-6 alcoholic drinks a night for the last 40 years.  Drinks beer as well as vodka.  He endorses issues to balance for the last few months. He quit smoking about 4 year ago.  LKW: 1500 over 05/02/2021. mRS: 0 tNKASE: Not offered due to symptoms too mild and outside the window. Thrombectomy: Not offered due to symptoms too mild and unlikely to be LVO NIHSS components Score: Comment  1a Level of Conscious 0[x]  1[]  2[]  3[]      1b LOC Questions 0[x]  1[]  2[]       1c LOC Commands 0[x]  1[]  2[]       2 Best Gaze 0[x]  1[]  2[]       3 Visual 0[x]  1[]  2[]  3[]      4 Facial Palsy 0[x]  1[]  2[]  3[]      5a Motor Arm - left 0[x]  1[]  2[]  3[]  4[]  UN[]    5b Motor Arm - Right 0[x]  1[]  2[]  3[]  4[]  UN[]    6a Motor Leg - Left 0[x]  1[]  2[]  3[]  4[]  UN[]    6b Motor Leg - Right 0[x]  1[]  2[]  3[]  4[]  UN[]    7 Limb Ataxia 0[]  1[]  2[x]  3[]  UN[]     8 Sensory 0[]  1[x]  2[]  UN[]      9 Best Language 0[x]  1[]  2[]  3[]      10 Dysarthria 0[x]  1[]  2[]  UN[]      11 Extinct. and Inattention 0[x]  1[]  2[]       TOTAL: 3        ROS   Constitutional Denies weight loss, fever and chills.   HEENT Denies changes in vision and  hearing.   Respiratory Denies SOB and cough.   CV Denies palpitations and CP   GI Denies abdominal pain, nausea, vomiting and diarrhea.   GU Denies dysuria and urinary frequency.   MSK Denies myalgia and joint pain.   Skin Denies rash and pruritus.   Neurological Denies headache and syncope.   Psychiatric Denies recent changes in mood. Denies anxiety and depression.    Past History   Past Medical History:  Diagnosis Date   HTN (hypertension)    History reviewed. No pertinent surgical history. Family History  Problem Relation Age of Onset   COPD Father    Diabetes Father    Hypertension Father    Hypertension Brother    Social History   Socioeconomic History   Marital status: Divorced    Spouse name: Not on file   Number of children: Not on file   Years of education: Not on file   Highest education  level: Not on file  Occupational History   Not on file  Tobacco Use   Smoking status: Former    Types: Cigarettes    Quit date: 2018    Years since quitting: 5.0   Smokeless tobacco: Current    Types: Chew  Substance and Sexual Activity   Alcohol use: Yes    Comment: 3-4 beers per day   Drug use: Never   Sexual activity: Not on file  Other Topics Concern   Not on file  Social History Narrative   Not on file   Social Determinants of Health   Financial Resource Strain: Not on file  Food Insecurity: Not on file  Transportation Needs: Not on file  Physical Activity: Not on file  Stress: Not on file  Social Connections: Not on file   No Known Allergies  Medications  (Not in a hospital admission)    Vitals   Vitals:   05/02/21 1951 05/02/21 2019 05/02/21 2200 05/02/21 2315  BP: (!) 207/124  (!) 165/108 (!) 178/103  Pulse: (!) 107  86 82  Resp:   19 19  Temp:      SpO2: 99% 99% 95% 96%  Height:  5\' 11"  (1.803 m)       Body mass index is 27.89 kg/m.  Physical Exam   General: Laying comfortably in bed; in no acute distress.  HENT: Normal oropharynx  and mucosa. Normal external appearance of ears and nose.  Neck: Supple, no pain or tenderness  CV: No JVD. No peripheral edema.  Pulmonary: Symmetric Chest rise. Normal respiratory effort.  Abdomen: Soft to touch, non-tender.  Ext: No cyanosis, edema, or deformity  Skin: No rash. Normal palpation of skin.   Musculoskeletal: Normal digits and nails by inspection. No clubbing.   Neurologic Examination  Mental status/Cognition: Alert, oriented to self, place, month and year, good attention.  Speech/language: Fluent, comprehension intact, object naming intact, repetition intact.  Cranial nerves:   CN II Pupils equal and reactive to light, no VF deficits    CN III,IV,VI EOM intact, no gaze preference or deviation, no nystagmus    CN V normal sensation in V1, V2, and V3 segments bilaterally    CN VII no asymmetry, no nasolabial fold flattening    CN VIII normal hearing to speech    CN IX & X normal palatal elevation, no uvular deviation    CN XI 5/5 head turn and 5/5 shoulder shrug bilaterally    CN XII midline tongue protrusion    Motor:  Muscle bulk: normal, tone normal, pronator drift none tremor none Mvmt Root Nerve  Muscle Right Left Comments  SA C5/6 Ax Deltoid 5 5   EF C5/6 Mc Biceps 5 5   EE C6/7/8 Rad Triceps 5 5   WF C6/7 Med FCR     WE C7/8 PIN ECU     F Ab C8/T1 U ADM/FDI 5 5   HF L1/2/3 Fem Illopsoas 5 5   KE L2/3/4 Fem Quad 5 5   DF L4/5 D Peron Tib Ant 5 5   PF S1/2 Tibial Grc/Sol 5 5    Reflexes:  Right Left Comments  Pectoralis      Biceps (C5/6) 2 2   Brachioradialis (C5/6) 2 2    Triceps (C6/7) 2 2    Patellar (L3/4) 2 2    Achilles (S1)      Hoffman      Plantar     Jaw jerk    Sensation:  Light touch Decreased mildly in LLE to touch and in LUE to touch   Pin prick    Temperature    Vibration   Proprioception    Coordination/Complex Motor:  - Finger to Nose with mild ataxia in LUE - Heel to shin ataxia BL - Rapid alternating movement are  normal - Gait: Deferred for patient safety.  Labs   CBC:  Recent Labs  Lab 05/02/21 2030 05/02/21 2038  WBC 9.1  --   NEUTROABS 5.9  --   HGB 18.0* 17.7*  HCT 51.0 52.0  MCV 94.1  --   PLT 240  --     Basic Metabolic Panel:  Lab Results  Component Value Date   NA 138 05/02/2021   K 3.8 05/02/2021   CO2 26 05/02/2021   GLUCOSE 103 (H) 05/02/2021   BUN 15 05/02/2021   CREATININE 0.90 05/02/2021   CALCIUM 9.1 05/02/2021   GFRNONAA >60 05/02/2021   GFRAA 93 01/03/2020   Lipid Panel:  Lab Results  Component Value Date   LDLCALC 72 01/03/2020   HgbA1c:  Lab Results  Component Value Date   HGBA1C 5.2 05/03/2021   Urine Drug Screen: No results found for: LABOPIA, COCAINSCRNUR, LABBENZ, AMPHETMU, THCU, LABBARB  Alcohol Level No results found for: [place]  CT Head without contrast(Personally reviewed): CTH was negative for a large hypodensity concerning for a large territory infarct or hyperdensity concerning for an ICH  MR Angio head without contrast and Carotid Duplex BL(Personally reviewed): pending  MRI Brain(Personally reviewed): Small R pontine stroke   Impression   LASHONE LODUCA is a 56 y.o. male with PMH significant for hypertension, Etoh use, who presents with acute onset left-sided numbness. Found to have a small R pontine stroke. His neurologic examination is notable for BL lower extremity ataxia and L sided numbness. Suspects that his numbness is likely 2/2 the pontine stroke and his BL lower extremity ataxia is probably from longstanding EtOh use.  Primary Diagnosis:  Other cerebral infarction due to occlusion of stenosis of small artery.  Secondary Diagnosis: Essential (primary) hypertension Alcohol use disorder BL lower extremity ataxia.  Recommendations   R pontine small vessel stroke: - Frequent Neuro checks per stroke unit protocol - Recommend brain imaging with MRI Brain without contrast - Recommend Vascular imaging with MRA Angio Head without  contrast and US Carotid doppler - Recommend obtaining TTE - Recommend obtaining Lipid panel with LDL - Please start statin if LDL > 70 - Recommend HbA1c - Antithrombotic - Aspirin 81mg  daily along with plavix 75mg  daily x 21 days followed by Aspirin 81mg  daily alone. - Recommend DVT ppx - SBP goal - permissive hypertension first 24 h < 220/110. Held home meds.  - Recommend Telemetry monitoring for arrythmia - Recommend bedside swallow screen prior to PO intake. - Stroke education booklet - Recommend PT/OT/SLP consult  Alcohol use disorder: - Recommend Vit B12, Folate, TSH, thiamine along with Thiamine replacement and PO multivitamin given BL lower extremity ataxia. - Thiamine 100mg  daily IV or PO and continuing at discharge.   Plan discussed with Dr. Alcario Drought. ______________________________________________________________________   Thank you for the opportunity to take part in the care of this patient. If you have any further questions, please contact the neurology consultation attending.  Signed,  [place] Pager Number [phone] _ _ _   _ __   _ __ _ _  __ __   _ __   __ _

## 2021-05-03 NOTE — ED Notes (Signed)
The pt  appears to have some nystagmus  lt eye when looking far rt

## 2021-05-03 NOTE — Progress Notes (Incomplete)
*  PRELIMINARY RESULTS* Echocardiogram 2D Echocardiogram has been performed.  Elpidio Anis 05/03/2021, 4:05 PM

## 2021-05-03 NOTE — ED Notes (Signed)
The pt  reports that he has a sl numbness in his lt arm otherwise no discomfort

## 2021-05-03 NOTE — ED Notes (Signed)
The pt reports that his lt arm still feels like it is asleep but the function is ok

## 2021-05-03 NOTE — Progress Notes (Addendum)
PROGRESS NOTE  Cameron Herrera  [id number] DOB: April 10, 1966 DOA: 05/02/2021 PCP: Patient, No Pcp Per (Inactive)   Brief Narrative: Cameron Herrera is a 56 y.o. male with a history of HTN, limited medical follow up, alcohol overuse who presented to the ED 1/7 with abrupt left side numbness. CT head initially negative, though small right pontine infarct confirmed on MRI with normal MRA. Stroke work up is underway and therapy consults are pending. Symptoms remain stable.   Assessment & Plan: Principal Problem:   Acute ischemic stroke [facility]) Active Problems:   Essential hypertension   Alcohol use  Acute ischemic pontine CVA:  - Will need outpatient PCP follow up for ongoing risk factor monitoring and management. May need antihypertensive (permissive HTN for now). HbA1c is 5.2%. - LDL 100 -> start high intensity statin due to CVA.  - Start DAPT, await final recommendation from stroke neurology - PT/OT/SLP pending.   Vitamin B12 deficiency: May be related to imbalance reported more chronically.  - Supplement by IM now, continue oral supplement (in addition to thiamine) at discharge.   Alcohol overuse: 2-6 drinks (between beer and vodka) nightly for many many years.  - Moderation counseling provided.  - CIWA.  History of tobacco use: quit 4 years ago.   Polycythemia: With reportedly no longer smoking, and the chronic nature (seen in Sept 2021), would consider hematology evaluation as outpatient unless felt to be secondary.  DVT prophylaxis: Lovenox Code Status: Full Family Communication: None at bedside Disposition Plan:  Status is: Inpatient  Remains inpatient appropriate because: continued stroke work up, continued symptoms from stroke.  Consultants:  Neurology  Subjective: Still with stable abnormal sensation, incomplete numbness affecting left side of his body. Clumsy a bit, though is RHD.   Objective: Vitals:   05/03/21 0729 05/03/21 0900 05/03/21 1000 05/03/21 1100  BP: (!)  162/109 (!) 147/97 (!) 137/96 (!) 146/114  Pulse: 72 72 78 100  Resp: _0 (!) 21  Temp: 98.7 F (37.1 C)     TempSrc: Oral     SpO2: 95% 94% 95% 95%  Height:       Gen: 56 y.o. male in no distress  Pulm: Non-labored breathing room air. Clear to auscultation bilaterally.  CV: Regular rate and rhythm. No murmur, rub, or gallop. No JVD, no pitting pedal edema. GI: Abdomen soft, non-tender, non-distended, with normoactive bowel sounds. No organomegaly or masses felt. Ext: Warm, no deformities Skin: No rashes, lesions or ulcers Neuro: Alert and oriented. Abnormal sensation affecting all dermatomes of LUE and LLE extending to the left neck/posterior head. Mild ataxia on left, but also somewhat on RLE, strength 5/5. No other focal neurological deficits. Psych: Judgement and insight appear normal. Mood & affect appropriate.   Cr wnl AST, ALT, alk phos: wnl TBili 1.5.  Albumin 4.4 Hgb 18, plt 240k, WBC 9.1k.  HbA1C: 5.2% LDL 100, HDL 46 Vitamin N56 213, folic acid 08.6.  Covid, flu: Negative.  CT head: negative MRI brain: Small right pontine infarct MRA: Normal vessels of head and neck    Patrecia Pour, MD [facility] [email or web address] 05/03/2021, 12:10 PM

## 2021-05-03 NOTE — Progress Notes (Addendum)
STROKE TEAM PROGRESS NOTE   ATTENDING NOTE: I reviewed above note and agree with the assessment and plan. Pt was seen and examined.   56 year old male with history of hypertension, heavy alcohol use, former smoker admitted for left-sided tingling, from left back of head, left neck, arm, trunk and leg.  CT no acute abnormality.  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  MRA head and neck unremarkable.  2D pending, UDS pending.  LDL 100, A1c 5.2.  Creatinine 0.9.  Hemoglobin 17.6-18.0-17.7, likely secondary to smokeless tobacco.  BP still on the higher end 200s->160s.  On exam, neurological intact, no focal deficit, except subjectively left arm and leg tingling.  Etiology for patient stroke likely small vessel disease given location and risk factors.  Recommend aspirin 81 and Plavix 75 DAPT for 3 weeks and then aspirin alone.  Added Lipitor 40, continue on discharge.  Limiting alcohol education provided and patient willing to cut down alcohol use.  Also educated on smokeless tobacco cessation, patient willing to quit.  Aggressive risk factor modification.  For detailed assessment and plan, please refer to above as I have made changes wherever appropriate.   Neurology will sign off. Please call with questions. [facility]. Thanks for the consult.   Marvel PlanJindong , MD PhD Stroke Neurology 05/03/2021 5:44 PM    INTERVAL HISTORY Patient was seen and assessed at the bedside. Patient reports to still feel persistent "pins and needles" on the left half of his body and neck but sparing his face. He reports it feels like the left side of his body has "fallen asleep". Patient denies any weakness, coordination problems, or other sensory deficits. Patient does admit to using dipping tobacco and drinking alcohol heavily daily. Patient also reports not checking his blood pressure regularly.   MRA of head and neck showed no acute abnormalities. MRI showed  small focus of acute/subacute ischemia in dorsal right pons. LDL cholesterol 100, A1c 5.2  Discussed with patient importance of controlling cholesterol, monitoring blood pressure and abstaining from dipping tobacco and alcohol to prevent further strokes. Discussed that patient would require 3 weeks of plavix and aspirin followed by aspirin 81 mg daily. Patient agreeable to plan and had no other questions at this time.  Vitals:   05/03/21 0130 05/03/21 0145 05/03/21 0500 05/03/21 0729  BP: (!) 165/115 (!) 152/105 (!) 145/87 (!) 162/109  Pulse: 91 92 76 72  Resp: 17 15 15 15   Temp:    98.7 F (37.1 C)  TempSrc:    Oral  SpO2: 92% 92% 95% 95%  Height:       CBC:  Recent Labs  Lab 05/02/21 2030 05/02/21 2038  WBC 9.1  --   NEUTROABS 5.9  --   HGB 18.0* 17.7*  HCT 51.0 52.0  MCV 94.1  --   PLT 240  --    Basic Metabolic Panel:  Recent Labs  Lab 05/02/21 2030 05/02/21 2038  NA 140 138  K 3.7 3.8  CL 103 101  CO2 26  --   GLUCOSE 104* 103*  BUN 12 15  CREATININE 0.98 0.90  CALCIUM 9.1  --    Lipid Panel:  Recent Labs  Lab 05/03/21 0046  CHOL 164  TRIG 92  HDL 46  CHOLHDL 3.6  VLDL 18  LDLCALC 562100*   HgbA1c:  Recent Labs  Lab 05/03/21 0044  HGBA1C 5.2   Urine Drug Screen: No results for  input(s): LABOPIA, COCAINSCRNUR, LABBENZ, AMPHETMU, THCU, LABBARB in the last 168 hours.  Alcohol Level No results for input(s): ETH in the last 168 hours.  IMAGING past 24 hours CT HEAD WO CONTRAST  Result Date: 05/02/2021 CLINICAL DATA:  Numbness to the left side of face and arm EXAM: CT HEAD WITHOUT CONTRAST TECHNIQUE: Contiguous axial images were obtained from the base of the skull through the vertex without intravenous contrast. COMPARISON:  None. FINDINGS: Brain: No evidence of acute infarction, hemorrhage, hydrocephalus, extra-axial collection or mass lesion/mass effect. Vascular: No hyperdense vessel or unexpected calcification. Skull: Normal. Negative for fracture or  focal lesion. Sinuses/Orbits: Mucosal thickening in the sinuses Other: None IMPRESSION: Negative non contrasted CT appearance of the brain Electronically Signed   By: Jasmine Pang M.D.   On: 05/02/2021 20:44   MR ANGIO HEAD WO CONTRAST  Result Date: 05/03/2021 CLINICAL DATA:  Stroke EXAM: MRA NECK WITHOUT AND WITH CONTRAST MRA HEAD WITHOUT CONTRAST TECHNIQUE: Multiplanar and multiecho pulse sequences of the neck were obtained without and with intravenous contrast. Angiographic images of the neck were obtained using MRA technique without and with intravenous contrast; Angiographic images of the Circle of Willis were obtained using MRA technique without intravenous contrast. CONTRAST:  1mL GADAVIST GADOBUTROL 1 MMOL/ML IV SOLN COMPARISON:  None. FINDINGS: MRA NECK FINDINGS Normal carotid and vertebral arteries. The vertebral system is codominant. 3 vessel branch pattern of the aortic arch. MRA HEAD FINDINGS POSTERIOR CIRCULATION: --Vertebral arteries: Normal --Inferior cerebellar arteries: Normal. --Basilar artery: Normal. --Superior cerebellar arteries: Normal. --Posterior cerebral arteries: Normal. ANTERIOR CIRCULATION: --Intracranial internal carotid arteries: Normal. --Anterior cerebral arteries (ACA): Normal. --Middle cerebral arteries (MCA): Normal. ANATOMIC VARIANTS: Fetal origin of the right PCA. IMPRESSION: Normal MRA of the head and neck. Electronically Signed   By: Deatra Robinson M.D.   On: 05/03/2021 03:33   MR ANGIO NECK W WO CONTRAST  Result Date: 05/03/2021 CLINICAL DATA:  Stroke EXAM: MRA NECK WITHOUT AND WITH CONTRAST MRA HEAD WITHOUT CONTRAST TECHNIQUE: Multiplanar and multiecho pulse sequences of the neck were obtained without and with intravenous contrast. Angiographic images of the neck were obtained using MRA technique without and with intravenous contrast; Angiographic images of the Circle of Willis were obtained using MRA technique without intravenous contrast. CONTRAST:  14mL GADAVIST  GADOBUTROL 1 MMOL/ML IV SOLN COMPARISON:  None. FINDINGS: MRA NECK FINDINGS Normal carotid and vertebral arteries. The vertebral system is codominant. 3 vessel branch pattern of the aortic arch. MRA HEAD FINDINGS POSTERIOR CIRCULATION: --Vertebral arteries: Normal --Inferior cerebellar arteries: Normal. --Basilar artery: Normal. --Superior cerebellar arteries: Normal. --Posterior cerebral arteries: Normal. ANTERIOR CIRCULATION: --Intracranial internal carotid arteries: Normal. --Anterior cerebral arteries (ACA): Normal. --Middle cerebral arteries (MCA): Normal. ANATOMIC VARIANTS: Fetal origin of the right PCA. IMPRESSION: Normal MRA of the head and neck. Electronically Signed   By: Deatra Robinson M.D.   On: 05/03/2021 03:33   MR BRAIN WO CONTRAST  Result Date: 05/02/2021 CLINICAL DATA:  Left facial numbness EXAM: MRI HEAD WITHOUT CONTRAST TECHNIQUE: Multiplanar, multiecho pulse sequences of the brain and surrounding structures were obtained without intravenous contrast. COMPARISON:  None. FINDINGS: Brain: Small focus of acute/subacute ischemia in the dorsal right pons. No other area of acute ischemia. No acute or chronic hemorrhage. Normal white matter signal, parenchymal volume and CSF spaces. The midline structures are normal. Vascular: Major flow voids are preserved. Skull and upper cervical spine: Normal calvarium and skull base. Visualized upper cervical spine and soft tissues are normal. Sinuses/Orbits:No paranasal sinus fluid levels or  advanced mucosal thickening. No mastoid or middle ear effusion. Normal orbits. IMPRESSION: Small focus of acute/subacute ischemia in the dorsal right pons. No hemorrhage or mass effect. Electronically Signed   By: Deatra Robinson M.D.   On: 05/02/2021 23:22    PHYSICAL EXAM  Temp:  [98.5 F (36.9 C)-98.7 F (37.1 C)] 98.7 F (37.1 C) (01/08 0729) Pulse Rate:  [72-111] 90 (01/08 1200) Resp:  [12-21] 20 (01/08 1200) BP: (137-207)/(87-130) 150/102 (01/08 1200) SpO2:   [92 %-99 %] 95 % (01/08 1200)  General - Well nourished, well developed, in no apparent distress.  Cardiovascular - Regular rhythm and rate.  Mental Status -  Level of arousal and orientation to time, place, and person were intact. Language including expression, naming, repetition, comprehension was assessed and found intact. Attention span and concentration were normal. Recent and remote memory were intact. Fund of Knowledge was assessed and was intact.  Cranial Nerves II - XII - II - Visual field intact. III, IV, VI - Extraocular movements intact. V - Facial sensation intact bilaterally. VII - Facial movement intact bilaterally. VIII - Hearing & vestibular intact bilaterally. X - Palate elevates symmetrically. XI - Chin turning & shoulder shrug intact bilaterally. XII - Tongue protrusion intact.  Motor Strength - The patients strength was normal in all extremities and pronator drift was absent.  Bulk was normal and fasciculations were absent.   Motor Tone - Muscle tone was assessed at the neck and appendages and was normal.  Reflexes - The patients reflexes were symmetrical in all extremities and he had no pathological reflexes.  Sensory - Light touch, temperature/pinprick were assessed and were symmetrical.    Coordination - The patient had normal movements in the hands and feet with no ataxia or dysmetria.  Tremor was absent.  Gait and Station - deferred.  ASSESSMENT/PLAN Cameron Herrera is a 56 y.o. male with history of HTN, heavy alcohol use, nicotine dependence presenting with acute onset left sided numbness.   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 Code Stroke CT  No acute abnormality. MRI  acute/subacute ischemia in the dorsal right pons MRA  normal MRA of head and neck 2D Echo Pending LDL 100 HgbA1c 5.2 VTE prophylaxis - Lovenox No antithrombotic prior to admission, now on aspirin 81 mg daily and clopidogrel 75 mg daily DAPT  for 3 weeks and then ASA alone.  Therapy recommendations: pending Disposition:  pending  Hypertension Home meds:  Lisinopril Uncontrolled Gradually normalize BP in 2 to 3 days Long-term BP goal normotensive  Hyperlipidemia Home meds:  none LDL 100, goal < 70 Add lipitor 40 Continue statin at discharge  Other Stroke Risk Factors Dipping Tobacco: advised to stop. Heavy ETOH use, advised to drink no more than 1-2 drink(s) a day, on CIWA protocol with Ativan  Other Active Problems   Hospital day # 1  Park Pope, MD PGY1 Resident  To contact Stroke Continuity provider, please refer to [email or web address]. After hours, contact General Neurology

## 2021-05-03 NOTE — ED Notes (Signed)
Pt getting an echo

## 2021-05-04 MED ORDER — CLOPIDOGREL BISULFATE 75 MG PO TABS: 75.0000 mg | ORAL_TABLET | Freq: Every day | ORAL | 0 refills | Status: DC

## 2021-05-04 MED ORDER — ASPIRIN 81 MG PO TBEC: 81.0000 mg | DELAYED_RELEASE_TABLET | Freq: Every day | ORAL | 1 refills | Status: AC

## 2021-05-04 MED ORDER — THIAMINE HCL 100 MG PO TABS: 100.0000 mg | ORAL_TABLET | Freq: Every day | ORAL | 1 refills | Status: DC

## 2021-05-04 MED ORDER — ATORVASTATIN CALCIUM 40 MG PO TABS: 40.0000 mg | ORAL_TABLET | Freq: Every day | ORAL | 1 refills | Status: DC

## 2021-05-04 MED ORDER — VITAMIN B-12 1000 MCG PO TABS: 1000.0000 ug | ORAL_TABLET | Freq: Every day | ORAL | 1 refills | Status: DC

## 2021-05-04 NOTE — Discharge Summary (Signed)
Physician Discharge Summary  Cameron Herrera:[id number] DOB: March 29, 1966 DOA: 05/02/2021  PCP: Patient, No Pcp Per (Inactive)  Admit date: 05/02/2021 Discharge date: 05/04/2021  Admitted From: Home Disposition: Home   Recommendations for Outpatient Follow-up:  Follow up with PCP in 1-2 weeks.  Recommend recheck CBC at follow up. If polycythemia persists, consider hematology evaluation. Monitor vitamin B12 levels (supplementation started for deficiency at admission).  Recommend monitoring LFTs, LDL, started atorvastatin 40mg . Cessation counseling for smokeless tobacco recommended Alcohol moderation counseling also recommended.  Follow up with neurology, stroke clinic NP at [facility], in about 4 weeks.  Home Health: None (outpatient PT/OT) Equipment/Devices: None Discharge Condition: Stable CODE STATUS: Full Diet recommendation: Heart healthy  Brief/Interim Summary: Cameron Herrera is a 56 y.o. male with a history of HTN, limited medical follow up, alcohol overuse who presented to the ED 1/7 with abrupt left side numbness. CT head initially negative, though small right pontine infarct confirmed on MRI with normal MRA. Echocardiogram did not reveal significant abnormality, including no cardioembolic source. Stroke etiology felt to be small vessel disease. DAPT x3 weeks followed by aspirin 81mg  alone as well as lipitor 40mg  are recommended by stroke neurology at discharge. Symptoms are stable and the patient will be referred to outpatient PT/OT.    Discharge Diagnoses:  Principal Problem:   Acute ischemic stroke [facility]) Active Problems:   Essential hypertension   Alcohol use    Acute ischemic pontine CVA: Echocardiogram showed normal biventricular function without evidence of hemodynamically significant valvular heart disease. No intracardiac source of embolism detected on TTE. - Will need outpatient PCP follow up for ongoing risk factor monitoring and management. May need antihypertensive, though BP  coming down since admission. HbA1c is 5.2%. - LDL 100 -> start high intensity statin due to CVA.  - Start DAPT x3 weeks, followed by aspirin 81mg  alone per stroke neurology    Vitamin B12 deficiency: May be related to imbalance reported more chronically.  - Supplement by IM while admitted, continue oral supplement (in addition to thiamine) at discharge.    Alcohol overuse: 2-6 drinks (between beer and vodka) nightly for many many years. UDS negative.  - Moderation counseling provided.    History of tobacco use: quit 4 years ago.    Polycythemia: With reportedly no longer smoking, and the chronic nature (seen in Sept 2021), would consider hematology evaluation as outpatient unless felt to be secondary.  Discharge Instructions Discharge Instructions     Ambulatory referral to Neurology   Complete by: As directed    Follow up with stroke clinic NP (Jessica Vanschaick or Darrol Angel, if both not available, consider Manson Allan, or Ahern) at [facility] in about 4 weeks. Thanks.   Diet - low sodium heart healthy   Complete by: As directed    Discharge instructions   Complete by: As directed    You were evaluated for a small stroke that has caused numbness/tingling that is stable. You are stable for discharge with the following recommendations:  - Establish care with a PCP as soon as possible. You will need ongoing care.  - Start taking aspirin and plavix for 3 weeks, then stop plavix and continue aspirin 81mg  alone.  - Start taking lipitor 40mg  daily. This and the medications above reduce your risk of recurrent stroke. - Start taking thiamine and vitamin B12. You were deficient in B12 which can cause neuropathy, and you are likely deficient in thiamine as well.  - Moderate drinking to < 2 drinks per  day. Avoid tobacco in all forms. - Follow up with [facility] in 4 weeks. Call their office if you aren't contacted to schedule an appointment.  - Continue rehabilitation at  outpatient therapy which will be arranged prior to discharge. - If your symptoms return or new numbness or weakness or speech changes appear, seek medical attention right away.      Allergies as of 05/04/2021   No Known Allergies      Medication List     STOP taking these medications    cyclobenzaprine 5 MG tablet Commonly known as: FLEXERIL   lisinopril 20 MG tablet Commonly known as: ZESTRIL       TAKE these medications    aspirin 81 MG EC tablet Take 1 tablet (81 mg total) by mouth daily. Swallow whole.   atorvastatin 40 MG tablet Commonly known as: LIPITOR Take 1 tablet (40 mg total) by mouth daily.   clopidogrel 75 MG tablet Commonly known as: PLAVIX Take 1 tablet (75 mg total) by mouth daily.   multivitamin tablet Take 1 tablet by mouth daily.   thiamine 100 MG tablet Take 1 tablet (100 mg total) by mouth daily.   vitamin B-12 1000 MCG tablet Commonly known as: CYANOCOBALAMIN Take 1 tablet (1,000 mcg total) by mouth daily.        Follow-up Information     [facility]. Schedule an appointment as soon as possible for a visit in 1 month(s).   Specialty: Neurology Why: stroke clinic Contact information: [address] [place] [address] [phone]        Primary Care Provider. Schedule an appointment as soon as possible for a visit in 1 week(s).                 No Known Allergies  Consultations: Neurology  Procedures/Studies: CT HEAD WO CONTRAST  Result Date: 05/02/2021 CLINICAL DATA:  Numbness to the left side of face and arm EXAM: CT HEAD WITHOUT CONTRAST TECHNIQUE: Contiguous axial images were obtained from the base of the skull through the vertex without intravenous contrast. COMPARISON:  None. FINDINGS: Brain: No evidence of acute infarction, hemorrhage, hydrocephalus, extra-axial collection or mass lesion/mass effect. Vascular: No hyperdense vessel or unexpected calcification. Skull:  Normal. Negative for fracture or focal lesion. Sinuses/Orbits: Mucosal thickening in the sinuses Other: None IMPRESSION: Negative non contrasted CT appearance of the brain Electronically Signed   By: Jasmine Pang M.D.   On: 05/02/2021 20:44   MR ANGIO HEAD WO CONTRAST  Result Date: 05/03/2021 CLINICAL DATA:  Stroke EXAM: MRA NECK WITHOUT AND WITH CONTRAST MRA HEAD WITHOUT CONTRAST TECHNIQUE: Multiplanar and multiecho pulse sequences of the neck were obtained without and with intravenous contrast. Angiographic images of the neck were obtained using MRA technique without and with intravenous contrast; Angiographic images of the Circle of Willis were obtained using MRA technique without intravenous contrast. CONTRAST:  43mL GADAVIST GADOBUTROL 1 MMOL/ML IV SOLN COMPARISON:  None. FINDINGS: MRA NECK FINDINGS Normal carotid and vertebral arteries. The vertebral system is codominant. 3 vessel branch pattern of the aortic arch. MRA HEAD FINDINGS POSTERIOR CIRCULATION: --Vertebral arteries: Normal --Inferior cerebellar arteries: Normal. --Basilar artery: Normal. --Superior cerebellar arteries: Normal. --Posterior cerebral arteries: Normal. ANTERIOR CIRCULATION: --Intracranial internal carotid arteries: Normal. --Anterior cerebral arteries (ACA): Normal. --Middle cerebral arteries (MCA): Normal. ANATOMIC VARIANTS: Fetal origin of the right PCA. IMPRESSION: Normal MRA of the head and neck. Electronically Signed   By: Deatra Robinson M.D.   On: 05/03/2021  03:33   MR ANGIO NECK W WO CONTRAST  Result Date: 05/03/2021 CLINICAL DATA:  Stroke EXAM: MRA NECK WITHOUT AND WITH CONTRAST MRA HEAD WITHOUT CONTRAST TECHNIQUE: Multiplanar and multiecho pulse sequences of the neck were obtained without and with intravenous contrast. Angiographic images of the neck were obtained using MRA technique without and with intravenous contrast; Angiographic images of the Circle of Willis were obtained using MRA technique without intravenous  contrast. CONTRAST:  67mL GADAVIST GADOBUTROL 1 MMOL/ML IV SOLN COMPARISON:  None. FINDINGS: MRA NECK FINDINGS Normal carotid and vertebral arteries. The vertebral system is codominant. 3 vessel branch pattern of the aortic arch. MRA HEAD FINDINGS POSTERIOR CIRCULATION: --Vertebral arteries: Normal --Inferior cerebellar arteries: Normal. --Basilar artery: Normal. --Superior cerebellar arteries: Normal. --Posterior cerebral arteries: Normal. ANTERIOR CIRCULATION: --Intracranial internal carotid arteries: Normal. --Anterior cerebral arteries (ACA): Normal. --Middle cerebral arteries (MCA): Normal. ANATOMIC VARIANTS: Fetal origin of the right PCA. IMPRESSION: Normal MRA of the head and neck. Electronically Signed   By: Deatra Robinson M.D.   On: 05/03/2021 03:33   MR BRAIN WO CONTRAST  Result Date: 05/02/2021 CLINICAL DATA:  Left facial numbness EXAM: MRI HEAD WITHOUT CONTRAST TECHNIQUE: Multiplanar, multiecho pulse sequences of the brain and surrounding structures were obtained without intravenous contrast. COMPARISON:  None. FINDINGS: Brain: Small focus of acute/subacute ischemia in the dorsal right pons. No other area of acute ischemia. No acute or chronic hemorrhage. Normal white matter signal, parenchymal volume and CSF spaces. The midline structures are normal. Vascular: Major flow voids are preserved. Skull and upper cervical spine: Normal calvarium and skull base. Visualized upper cervical spine and soft tissues are normal. Sinuses/Orbits:No paranasal sinus fluid levels or advanced mucosal thickening. No mastoid or middle ear effusion. Normal orbits. IMPRESSION: Small focus of acute/subacute ischemia in the dorsal right pons. No hemorrhage or mass effect. Electronically Signed   By: Deatra Robinson M.D.   On: 05/02/2021 23:22   ECHOCARDIOGRAM COMPLETE  Result Date: 05/03/2021    ECHOCARDIOGRAM REPORT   Patient Name:   LAUREN AGUAYO Date of Exam: 05/03/2021 Medical Rec #:  [id number]   Height:       71.0 in  Accession #:    [id number]  Weight:       200.0 lb Date of Birth:  Nov 16, 1965    BSA:          2.109 m Patient Age:    55 years    BP:           147/99 mmHg Patient Gender: M           HR:           81 bpm. Exam Location:  Inpatient Procedure: 2D Echo, Cardiac Doppler and Color Doppler Indications:    Stroke  History:        Patient has no prior history of Echocardiogram examinations.                 Risk Factors:Hypertension.  Sonographer:    Mikki Harbor Referring Phys: [phone] JARED M GARDNER IMPRESSIONS  1. Left ventricular ejection fraction, by estimation, is 65 to 70%. The left ventricle has normal function. The left ventricle has no regional wall motion abnormalities. Left ventricular diastolic parameters were normal.  2. Right ventricular systolic function is normal. The right ventricular size is normal. Tricuspid regurgitation signal is inadequate for assessing PA pressure.  3. The mitral valve is normal in structure. Trivial mitral valve regurgitation. No evidence of mitral stenosis.  4. The aortic  valve was not well visualized. There is mild calcification of the aortic valve. Aortic valve regurgitation is not visualized. No aortic stenosis is present.  5. The inferior vena cava is normal in size with greater than 50% respiratory variability, suggesting right atrial pressure of 3 mmHg. Comparison(s): No prior Echocardiogram. Conclusion(s)/Recommendation(s): Normal biventricular function without evidence of hemodynamically significant valvular heart disease. No intracardiac source of embolism detected on this transthoracic study. Consider a transesophageal echocardiogram to exclude cardiac source of embolism if clinically indicated. FINDINGS  Left Ventricle: Left ventricular ejection fraction, by estimation, is 65 to 70%. The left ventricle has normal function. The left ventricle has no regional wall motion abnormalities. The left ventricular internal cavity size was normal in size. There is  no left  ventricular hypertrophy. Left ventricular diastolic parameters were normal. Right Ventricle: The right ventricular size is normal. No increase in right ventricular wall thickness. Right ventricular systolic function is normal. Tricuspid regurgitation signal is inadequate for assessing PA pressure. Left Atrium: Left atrial size was normal in size. Right Atrium: Right atrial size was normal in size. Pericardium: There is no evidence of pericardial effusion. Presence of epicardial fat layer. Mitral Valve: The mitral valve is normal in structure. Trivial mitral valve regurgitation. No evidence of mitral valve stenosis. MV peak gradient, 6.2 mmHg. The mean mitral valve gradient is 2.0 mmHg. Tricuspid Valve: The tricuspid valve is normal in structure. Tricuspid valve regurgitation is trivial. No evidence of tricuspid stenosis. Aortic Valve: The aortic valve was not well visualized. There is mild calcification of the aortic valve. Aortic valve regurgitation is not visualized. No aortic stenosis is present. Aortic valve mean gradient measures 4.0 mmHg. Aortic valve peak gradient  measures 8.5 mmHg. Aortic valve area, by VTI measures 2.92 cm. Pulmonic Valve: The pulmonic valve was grossly normal. Pulmonic valve regurgitation is trivial. No evidence of pulmonic stenosis. Aorta: The aortic root, ascending aorta and aortic arch are all structurally normal, with no evidence of dilitation or obstruction. Venous: The inferior vena cava is normal in size with greater than 50% respiratory variability, suggesting right atrial pressure of 3 mmHg. IAS/Shunts: The atrial septum is grossly normal.  LEFT VENTRICLE PLAX 2D LVIDd:         4.60 cm     Diastology LVIDs:         2.80 cm     LV e' medial:  7.29 cm/s LV PW:         1.00 cm     LV e' lateral: 11.20 cm/s LV IVS:        1.10 cm LVOT diam:     2.10 cm LV SV:         73 LV SV Index:   35 LVOT Area:     3.46 cm  LV Volumes (MOD) LV vol d, MOD A2C: 47.4 ml LV vol d, MOD A4C: 37.0 ml  LV vol s, MOD A2C: 20.3 ml LV vol s, MOD A4C: 24.7 ml LV SV MOD A2C:     27.1 ml LV SV MOD A4C:     37.0 ml LV SV MOD BP:      22.2 ml RIGHT VENTRICLE RV Basal diam:  3.25 cm RV Mid diam:    2.20 cm RV S prime:     11.00 cm/s TAPSE (M-mode): 2.6 cm LEFT ATRIUM             Index        RIGHT ATRIUM  Index LA diam:        3.80 cm 1.80 cm/m   RA Area:     19.50 cm LA Vol (A2C):   50.7 ml 24.04 ml/m  RA Volume:   52.20 ml  24.76 ml/m LA Vol (A4C):   39.8 ml 18.88 ml/m LA Biplane Vol: 48.8 ml 23.14 ml/m  AORTIC VALVE                    PULMONIC VALVE AV Area (Vmax):    2.80 cm     PV Vmax:       0.98 m/s AV Area (Vmean):   2.57 cm     PV Peak grad:  3.8 mmHg AV Area (VTI):     2.92 cm AV Vmax:           146.00 cm/s AV Vmean:          88.400 cm/s AV VTI:            0.250 m AV Peak Grad:      8.5 mmHg AV Mean Grad:      4.0 mmHg LVOT Vmax:         118.00 cm/s LVOT Vmean:        65.600 cm/s LVOT VTI:          0.211 m LVOT/AV VTI ratio: 0.84  AORTA Ao Root diam: 3.30 cm MITRAL VALVE MV Area (PHT): 2.72 cm   SHUNTS MV Area VTI:   3.38 cm   Systemic VTI:  0.21 m MV Peak grad:  6.2 mmHg   Systemic Diam: 2.10 cm MV Mean grad:  2.0 mmHg MV Vmax:       1.25 m/s MV Vmean:      57.3 cm/s Jodelle RedBridgette Christopher MD Electronically signed by Jodelle RedBridgette Christopher MD Signature Date/Time: 05/03/2021/6:46:42 PM    Final      Subjective: Stable feeling like the left side of his body is going. Feels like he can do things he sets his mind to, so will stop chewing tobacco and cut down drinking.   Discharge Exam: Vitals:   05/04/21 0036 05/04/21 0343  BP: 129/85 132/86  Pulse: 72 96  Resp: 18 20  Temp: 98 F (36.7 C) 97.6 F (36.4 C)  SpO2: 97% 96%   General: Pt is alert, awake, not in acute distress Cardiovascular: RRR, S1/S2 +, no rubs, no gallops Respiratory: CTA bilaterally, no wheezing, no rhonchi Abdominal: Soft, NT, ND, bowel sounds + Extremities: No edema, no cyanosis Neuro: Alert, oriented, no  focal deficits in strength. Mild LLE ataxia noted, abnormal, diminished sensation to left body including arm, leg, torso, neck and posterior head.  Labs: BNP (last 3 results) No results for input(s): BNP in the last 8760 hours. Basic Metabolic Panel: Recent Labs  Lab 05/02/21 2030 05/02/21 2038  NA 140 138  K 3.7 3.8  CL 103 101  CO2 26  --   GLUCOSE 104* 103*  BUN 12 15  CREATININE 0.98 0.90  CALCIUM 9.1  --    Liver Function Tests: Recent Labs  Lab 05/02/21 2030  AST 25  ALT 34  ALKPHOS 67  BILITOT 1.5*  PROT 7.3  ALBUMIN 4.4   No results for input(s): LIPASE, AMYLASE in the last 168 hours. No results for input(s): AMMONIA in the last 168 hours. CBC: Recent Labs  Lab 05/02/21 2030 05/02/21 2038  WBC 9.1  --   NEUTROABS 5.9  --   HGB 18.0* 17.7*  HCT 51.0  52.0  MCV 94.1  --   PLT 240  --    Cardiac Enzymes: No results for input(s): CKTOTAL, CKMB, CKMBINDEX, TROPONINI in the last 168 hours. BNP: Invalid input(s): POCBNP CBG: No results for input(s): GLUCAP in the last 168 hours. D-Dimer No results for input(s): DDIMER in the last 72 hours. Hgb A1c Recent Labs    05/03/21 0044  HGBA1C 5.2   Lipid Profile Recent Labs    05/03/21 0046  CHOL 164  HDL 46  LDLCALC 100*  TRIG 92  CHOLHDL 3.6   Thyroid function studies Recent Labs    05/03/21 0338  TSH 1.478   Anemia work up Recent Labs    05/03/21 0338  VITAMINB12 170*  FOLATE 13.7   Urinalysis No results found for: COLORURINE, APPEARANCEUR, LABSPEC, PHURINE, GLUCOSEU, HGBUR, BILIRUBINUR, KETONESUR, PROTEINUR, UROBILINOGEN, NITRITE, LEUKOCYTESUR  Microbiology Recent Results (from the past 240 hour(s))  Resp Panel by RT-PCR (Flu A&B, Covid) Nasopharyngeal Swab     Status: None   Collection Time: 05/03/21 12:40 AM   Specimen: Nasopharyngeal Swab; Nasopharyngeal(NP) swabs in vial transport medium  Result Value Ref Range Status   SARS Coronavirus 2 by RT PCR NEGATIVE NEGATIVE Final     Comment: (NOTE) SARS-CoV-2 target nucleic acids are NOT DETECTED.  The SARS-CoV-2 RNA is generally detectable in upper respiratory specimens during the acute phase of infection. The lowest concentration of SARS-CoV-2 viral copies this assay can detect is 138 copies/mL. A negative result does not preclude SARS-Cov-2 infection and should not be used as the sole basis for treatment or other patient management decisions. A negative result may occur with  improper specimen collection/handling, submission of specimen other than nasopharyngeal swab, presence of viral mutation(s) within the areas targeted by this assay, and inadequate number of viral copies(<138 copies/mL). A negative result must be combined with clinical observations, patient history, and epidemiological information. The expected result is Negative.  Fact Sheet for Patients:  BloggerCourse.comhttps://www.fda.gov/media/152166/download  Fact Sheet for Healthcare Providers:  SeriousBroker.ithttps://www.fda.gov/media/152162/download  This test is no t yet approved or cleared by the [place] FDA and  has been authorized for detection and/or diagnosis of SARS-CoV-2 by FDA under an Emergency Use Authorization (EUA). This EUA will remain  in effect (meaning this test can be used) for the duration of the COVID-19 declaration under Section 564(b)(1) of the Act, 21 U.S.C.section 360bbb-3(b)(1), unless the authorization is terminated  or revoked sooner.       Influenza A by PCR NEGATIVE NEGATIVE Final   Influenza B by PCR NEGATIVE NEGATIVE Final    Comment: (NOTE) The Xpert Xpress SARS-CoV-2/FLU/RSV plus assay is intended as an aid in the diagnosis of influenza from Nasopharyngeal swab specimens and should not be used as a sole basis for treatment. Nasal washings and aspirates are unacceptable for Xpert Xpress SARS-CoV-2/FLU/RSV testing.  Fact Sheet for Patients: BloggerCourse.comhttps://www.fda.gov/media/152166/download  Fact Sheet for Healthcare  Providers: SeriousBroker.ithttps://www.fda.gov/media/152162/download  This test is not yet approved or cleared by the [place] FDA and has been authorized for detection and/or diagnosis of SARS-CoV-2 by FDA under an Emergency Use Authorization (EUA). This EUA will remain in effect (meaning this test can be used) for the duration of the COVID-19 declaration under Section 564(b)(1) of the Act, 21 U.S.C. section 360bbb-3(b)(1), unless the authorization is terminated or revoked.  Performed at [facility], [address] [place], [place] [address]     Time coordinating discharge: Approximately 40 minutes  Tyrone Nineyan B , MD  [facility] 05/04/2021, 7:54  AM

## 2021-05-04 NOTE — Evaluation (Signed)
Physical Therapy Evaluation Patient Details Name: Cameron Herrera MRN: [id number] DOB: [birth year] Today's Date: 05/04/2021  History of Present Illness  56 y.o. M admitted on 05/02/21 due to L sided numbness and heaviness. 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 PMH significant for HTN.  Clinical Impression  Patient presents with decreased mobility due to decreased balance noted with head turns and eyes closed.  Reports main symptom is L side numbness.  Educated on use of sensory systems for balance and need for lighting and sturdy footwear for fall prevention.  Pt admits to some balance issues recent with MD relating ETOH as factor.  Feel patient would benefit from follow up outpatient PT for balance training.  He lives in [place] so recommend [employer].  PT will not follow as likely for d/c.        Recommendations for follow up therapy are one component of a multi-disciplinary discharge planning process, led by the attending physician.  Recommendations may be updated based on patient status, additional functional criteria and insurance authorization.  Follow Up Recommendations Outpatient PT    Assistance Recommended at Discharge PRN  Patient can return home with the following  Other (comment) (no assistance needed at d/c)    Equipment Recommendations None recommended by PT  Recommendations for Other Services       Functional Status Assessment Patient has had a recent decline in their functional status and demonstrates the ability to make significant improvements in function in a reasonable and predictable amount of time.     Precautions / Restrictions Precautions Precautions: None      Mobility  Bed Mobility Overal bed mobility: Independent                  Transfers Overall transfer level: Independent Equipment used: None                    Ambulation/Gait Ambulation/Gait assistance: Independent Gait Distance (Feet): 200 Feet Assistive  device: None Gait Pattern/deviations: WFL(Within Functional Limits)       General Gait Details: see DGI  Stairs Stairs: Yes Stairs assistance: Modified independent (Device/Increase time) Stair Management: One rail Right;Alternating pattern;Forwards Number of Stairs: 4 General stair comments: step through technique, rail to descend, none to ascend  Wheelchair Mobility    Modified Rankin (Stroke Patients Only) Modified Rankin (Stroke Patients Only) Pre-Morbid Rankin Score: No symptoms Modified Rankin: Slight disability     Balance Overall balance assessment: Modified Independent                               Standardized Balance Assessment Standardized Balance Assessment : Dynamic Gait Index   Dynamic Gait Index Level Surface: Normal Change in Gait Speed: Normal Gait with Horizontal Head Turns: Mild Impairment Gait with Vertical Head Turns: Moderate Impairment Gait and Pivot Turn: Normal Step Over Obstacle: Normal Step Around Obstacles: Normal Steps: Mild Impairment Total Score: 20       Pertinent Vitals/Pain Pain Assessment: No/denies pain    Home Living Family/patient expects to be discharged to:: Private residence Living Arrangements: Alone Available Help at Discharge: Family Type of Home: House Home Access: Stairs to enter Entrance Stairs-Rails: Can reach both Entrance Stairs-Number of Steps: 4 steps   Home Layout: One level Home Equipment: None      Prior Function Prior Level of Function : Independent/Modified Independent;Driving;Working/employed             Mobility  Comments: works part time driving to deliver equipment for heavy [employer]   Dominant Hand: Right    Extremity/Trunk Assessment   Upper Extremity Assessment Upper Extremity Assessment: Defer to OT evaluation    Lower Extremity Assessment Lower Extremity Assessment: LLE deficits/detail LLE Deficits / Details: WFL LLE Sensation:  decreased light touch (numbness & tingling entire L side)    Cervical / Trunk Assessment Cervical / Trunk Assessment: Normal  Communication   Communication: No difficulties  Cognition Arousal/Alertness: Awake/alert Behavior During Therapy: WFL for tasks assessed/performed Overall Cognitive Status: Within Functional Limits for tasks assessed                                          General Comments General comments (skin integrity, edema, etc.): Educated in fall prevention including footwear, lighting and grabbar in shower    Exercises     Assessment/Plan    PT Assessment All further PT needs can be met in the next venue of care  PT Problem List Decreased balance;Decreased safety awareness       PT Treatment Interventions      PT Goals (Current goals can be found in the Care Plan section)  Acute Rehab PT Goals PT Goal Formulation: All assessment and education complete, DC therapy    Frequency       Co-evaluation               AM-PAC PT "6 Clicks" Mobility  Outcome Measure Help needed turning from your back to your side while in a flat bed without using bedrails?: None Help needed moving from lying on your back to sitting on the side of a flat bed without using bedrails?: None Help needed moving to and from a bed to a chair (including a wheelchair)?: None Help needed standing up from a chair using your arms (e.g., wheelchair or bedside chair)?: None Help needed to walk in hospital room?: None Help needed climbing 3-5 steps with a railing? : None 6 Click Score: 24    End of Session   Activity Tolerance: Patient tolerated treatment well     PT Visit Diagnosis: Other abnormalities of gait and mobility (R26.89)    Time: 6599-3570 PT Time Calculation (min) (ACUTE ONLY): 23 min   Charges:   PT Evaluation $PT Eval Low Complexity: 1 Low PT Treatments $Self Care/Home Management: 8-22        Cameron Herrera, PT Acute Rehabilitation  Services [phone] [phone] 05/04/2021   Reginia Naas 05/04/2021, 9:46 AM

## 2021-05-04 NOTE — Progress Notes (Signed)
Occupational Therapy Treatment Patient Details Name: Cameron Herrera MRN: [id number] DOB: [birth year] Today's Date: 05/04/2021   History of present illness 56 y.o. M admitted on 05/02/21 due to L sided numbness and heaviness. 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 PMH significant for HTN.   OT comments  Ajay is progressing well. He continues to present L paresthesias that do not affect his strength or ROM. Educated pt on safety for IADLs like cooking and driving to avoid harm to the LUE. Pt demonstrated indep ability for BADLs and mobility. Pt has active d/c plans for home today. D/c recommendation remains appropriate.    Recommendations for follow up therapy are one component of a multi-disciplinary discharge planning process, led by the attending physician.  Recommendations may be updated based on patient status, additional functional criteria and insurance authorization.    Follow Up Recommendations  Outpatient OT    Assistance Recommended at Discharge None     Equipment Recommendations  None recommended by OT       Precautions / Restrictions Precautions Precautions: None Restrictions Weight Bearing Restrictions: No       Mobility Bed Mobility Overal bed mobility: Independent                  Transfers Overall transfer level: Independent Equipment used: None                     Balance Overall balance assessment: Modified Independent                               Standardized Balance Assessment Standardized Balance Assessment : Dynamic Gait Index   Dynamic Gait Index Level Surface: Normal Change in Gait Speed: Normal Gait with Horizontal Head Turns: Mild Impairment Gait with Vertical Head Turns: Moderate Impairment Gait and Pivot Turn: Normal Step Over Obstacle: Normal Step Around Obstacles: Normal Steps: Mild Impairment Total Score: 20     ADL either performed or assessed with clinical judgement   ADL Overall ADL's : Modified  independent                                       General ADL Comments: Pt has some mild LUE numbness/heaviness/incoordination, but it does not affect BADL's. completed grooming and dressing indep. reports he was OOB doing some "strateching" prior to my arrival    Extremity/Trunk Assessment Upper Extremity Assessment Upper Extremity Assessment: LUE deficits/detail LUE Deficits / Details: Tingling and heaviness LUE Sensation: decreased light touch LUE Coordination: decreased fine motor   Lower Extremity Assessment Lower Extremity Assessment: LLE deficits/detail LLE Deficits / Details: WFL LLE Sensation: decreased light touch (numbness & tingling entire L side)   Cervical / Trunk Assessment Cervical / Trunk Assessment: Normal    Vision   Vision Assessment?: Vision impaired- to be further tested in functional context   Perception Perception Perception: Within Functional Limits   Praxis Praxis Praxis: Intact    Cognition Arousal/Alertness: Awake/alert Behavior During Therapy: WFL for tasks assessed/performed Overall Cognitive Status: Within Functional Limits for tasks assessed                  General Comments VSS on RA, pt verbalized understanding of BE FAST stroke signs and symptoms    Pertinent Vitals/ Pain       Pain Assessment: No/denies pain  Home Living Family/patient expects  to be discharged to:: Private residence Living Arrangements: Alone Available Help at Discharge: Family Type of Home: House Home Access: Stairs to enter [employer] of Steps: 4 steps Entrance Stairs-Rails: Can reach both Home Layout: One level     Bathroom Shower/Tub: Chief Strategy Officer: Standard     Home Equipment: None           Progress Toward Goals  OT Goals(current goals can now be found in the care plan section)  Progress towards OT goals: Progressing toward goals  Acute Rehab OT Goals Patient Stated Goal: home OT Goal  Formulation: With patient Time For Goal Achievement: 05/17/21 Potential to Achieve Goals: Good ADL Goals Additional ADL Goal #1: Will indep verbalize at least 3 compensaotry techiques to increase safety  Plan Discharge plan remains appropriate       AM-PAC OT "6 Clicks" Daily Activity     Outcome Measure   Help from another person eating meals?: None Help from another person taking care of personal grooming?: None Help from another person toileting, which includes using toliet, bedpan, or urinal?: None Help from another person bathing (including washing, rinsing, drying)?: None Help from another person to put on and taking off regular upper body clothing?: None Help from another person to put on and taking off regular lower body clothing?: None 6 Click Score: 24    End of Session Equipment Utilized During Treatment: Gait belt  OT Visit Diagnosis: Muscle weakness (generalized) (M62.81);Hemiplegia and hemiparesis Hemiplegia - Right/Left: Left Hemiplegia - dominant/non-dominant: Non-Dominant Hemiplegia - caused by: Cerebral infarction   Activity Tolerance Patient tolerated treatment well   Patient Left in bed;with call bell/phone within reach   Nurse Communication Mobility status        Time: 1130-1150 OT Time Calculation (min): 20 min  Charges: OT General Charges $OT Visit: 1 Visit OT Treatments $Self Care/Home Management : 8-22 mins     A  05/04/2021, 1:01 PM

## 2021-05-04 NOTE — TOC Transition Note (Signed)
Transition of Care [facility]) - CM/SW Discharge Note   Patient Details  Name: Cameron Herrera MRN: [id number] Date of Birth: Sep 10, 1965  [facility] CM/SW Contact:  Kermit Balo, RN Phone Number: 05/04/2021, 10:14 AM   Clinical Narrative:    Patient is from home alone. He denies any medications prior to admission. He denies any transportation issues.  Recommendations are for outpatient therapy. Pt prefers to attend at [facility] Orders in Epic and information on the AVS. Pt is without a PcP. Pt interested in getting set up with MD in [place]. CM has placed information on the AVS and pt will need to call to schedule and appt.  Pt has transportation home today.         Patient Goals and CMS Choice        Discharge Placement                       Discharge Plan and Services                                     Social Determinants of Health (SDOH) Interventions     Readmission Risk Interventions No flowsheet data found.

## 2021-05-04 NOTE — Progress Notes (Signed)
Discharge instructions given. Patient verbalized understanding and all questions were answered. Awaiting ride. Family called.

## 2021-05-05 LAB — METHYLMALONIC ACID, SERUM: Methylmalonic Acid, Quantitative: 121 nmol/L (ref 0–378)

## 2021-05-06 LAB — VITAMIN B1: Vitamin B1 (Thiamine): 172.1 nmol/L (ref 66.5–200.0)

## 2021-05-11 ENCOUNTER — Other Ambulatory Visit: Payer: Self-pay | Admitting: Registered Nurse

## 2021-05-11 ENCOUNTER — Encounter: Payer: Self-pay | Admitting: Registered Nurse

## 2021-05-11 ENCOUNTER — Ambulatory Visit: Payer: 59 | Admitting: Registered Nurse

## 2021-05-11 VITALS — BP 146/90 | HR 87 | Temp 98.4°F | Resp 16 | Ht 71.0 in | Wt 204.0 lb

## 2021-05-11 DIAGNOSIS — I10 Essential (primary) hypertension: Secondary | ICD-10-CM | POA: Diagnosis not present

## 2021-05-11 DIAGNOSIS — Z09 Encounter for follow-up examination after completed treatment for conditions other than malignant neoplasm: Secondary | ICD-10-CM | POA: Diagnosis not present

## 2021-05-11 DIAGNOSIS — D582 Other hemoglobinopathies: Secondary | ICD-10-CM

## 2021-05-11 DIAGNOSIS — I639 Cerebral infarction, unspecified: Secondary | ICD-10-CM

## 2021-05-11 LAB — CBC WITH DIFFERENTIAL/PLATELET
Basophils Absolute: 0 10*3/uL (ref 0.0–0.1)
Basophils Relative: 0.4 % (ref 0.0–3.0)
Eosinophils Absolute: 0.1 10*3/uL (ref 0.0–0.7)
Eosinophils Relative: 1.8 % (ref 0.0–5.0)
HCT: 51.7 % (ref 39.0–52.0)
Hemoglobin: 17.4 g/dL — ABNORMAL HIGH (ref 13.0–17.0)
Lymphocytes Relative: 23.1 % (ref 12.0–46.0)
Lymphs Abs: 1.6 10*3/uL (ref 0.7–4.0)
MCHC: 33.7 g/dL (ref 30.0–36.0)
MCV: 95.8 fl (ref 78.0–100.0)
Monocytes Absolute: 0.6 10*3/uL (ref 0.1–1.0)
Monocytes Relative: 8.3 % (ref 3.0–12.0)
Neutro Abs: 4.7 10*3/uL (ref 1.4–7.7)
Neutrophils Relative %: 66.4 % (ref 43.0–77.0)
Platelets: 241 10*3/uL (ref 150.0–400.0)
RBC: 5.39 Mil/uL (ref 4.22–5.81)
RDW: 12.2 % (ref 11.5–15.5)
WBC: 7.1 10*3/uL (ref 4.0–10.5)

## 2021-05-11 LAB — COMPREHENSIVE METABOLIC PANEL
ALT: 49 U/L (ref 0–53)
AST: 25 U/L (ref 0–37)
Albumin: 4.7 g/dL (ref 3.5–5.2)
Alkaline Phosphatase: 75 U/L (ref 39–117)
BUN: 15 mg/dL (ref 6–23)
CO2: 28 mEq/L (ref 19–32)
Calcium: 9.3 mg/dL (ref 8.4–10.5)
Chloride: 103 mEq/L (ref 96–112)
Creatinine, Ser: 0.9 mg/dL (ref 0.40–1.50)
GFR: 94.85 mL/min (ref >60.00)
Glucose, Bld: 84 mg/dL (ref 70–99)
Potassium: 4.3 mEq/L (ref 3.5–5.1)
Sodium: 139 mEq/L (ref 135–145)
Total Bilirubin: 1.3 mg/dL — ABNORMAL HIGH (ref 0.2–1.2)
Total Protein: 7.3 g/dL (ref 6.0–8.3)

## 2021-05-11 LAB — VITAMIN D 25 HYDROXY (VIT D DEFICIENCY, FRACTURES): VITD: 37.9 ng/mL (ref 30.00–100.00)

## 2021-05-11 MED ORDER — TELMISARTAN-HCTZ 40-12.5 MG PO TABS: 1.0000 | ORAL_TABLET | Freq: Every day | ORAL | 0 refills | Status: DC

## 2021-05-11 NOTE — Patient Instructions (Signed)
Mr. Degnan -   Doristine Devoid to meet you  Let's get the BP down with telmisartan-hctz. Take this in the morning with plavix and aspirin.  Take atorvastatin in the evening.  Return as scheduled with me, continue PT/OT as scheduled, see Frann Rider as scheduled.  Thank you  Rich

## 2021-05-11 NOTE — Progress Notes (Signed)
Established Patient Office Visit  Subjective:  Patient ID: Cameron Herrera, male    DOB: [birth year]  Age: 56 y.o. MRN: [id number]  CC:  Chief Complaint  Patient presents with   Hospitalization Follow-up    HPI Cameron Herrera presents for HFU  Presented to ER on 05/02/21 with acute onset L side numbness.  CT head initially negative, MRI showed small R pontine infarct, normal MRA.  Echo was essentially normal.  Stroke etiology felt to be small vessel disease - pt with hx of primary htn, alcohol abuse, tobacco abuse with current smokeless tobacco use.   Started on DAPT x 3 weeks with plan to follow this with ASA 81mg  po qd and atorvastatin 40mg  po qd. Has been set up with PT, OT Has upcoming appt with [facility] Frann Rider NP on 06/22/21.  Since discharge, notes ongoing L side weakness and numbness. Stable. No new symptoms. BP today improved since hospitalization but above goal He has ceased all tobacco products and alcohol.   Past Medical History:  Diagnosis Date   HTN (hypertension)     History reviewed. No pertinent surgical history.  Family History  Problem Relation Age of Onset   Hypertension Mother    Hyperlipidemia Mother    Heart failure Mother    COPD Father        smoker   Diabetes Father    Hypertension Father    Hypertension Brother     Social History   Socioeconomic History   Marital status: Divorced    Spouse name: Not on file   Number of children: Not on file   Years of education: Not on file   Highest education level: Not on file  Occupational History   Not on file  Tobacco Use   Smoking status: Former    Types: Cigarettes    Quit date: 2018    Years since quitting: 5.0   Smokeless tobacco: Current    Types: Chew  Substance and Sexual Activity   Alcohol use: Yes    Comment: 3-4 beers per day + Vodka   Drug use: Never   Sexual activity: Not on file  Other Topics Concern   Not on file  Social History Narrative   Not on file   Social Determinants  of Health   Financial Resource Strain: Not on file  Food Insecurity: Not on file  Transportation Needs: Not on file  Physical Activity: Not on file  Stress: Not on file  Social Connections: Not on file  Intimate Partner Violence: Not on file    Outpatient Medications Prior to Visit  Medication Sig Dispense Refill   aspirin EC 81 MG EC tablet Take 1 tablet (81 mg total) by mouth daily. Swallow whole. 30 tablet 1   atorvastatin (LIPITOR) 40 MG tablet Take 1 tablet (40 mg total) by mouth daily. 30 tablet 1   clopidogrel (PLAVIX) 75 MG tablet Take 1 tablet (75 mg total) by mouth daily. 21 tablet 0   Multiple Vitamin (MULTIVITAMIN) tablet Take 1 tablet by mouth daily.     thiamine 100 MG tablet Take 1 tablet (100 mg total) by mouth daily. 30 tablet 1   vitamin B-12 (CYANOCOBALAMIN) 1000 MCG tablet Take 1 tablet (1,000 mcg total) by mouth daily. 30 tablet 1   No facility-administered medications prior to visit.    No Known Allergies  ROS Review of Systems  Constitutional: Negative.   HENT: Negative.    Eyes: Negative.   Respiratory: Negative.  Cardiovascular: Negative.   Gastrointestinal: Negative.   Endocrine: Negative.   Genitourinary: Negative.   Musculoskeletal: Negative.   Skin: Negative.   Allergic/Immunologic: Negative.   Neurological: Negative.   Hematological: Negative.   Psychiatric/Behavioral: Negative.    All other systems reviewed and are negative.    Objective:    Physical Exam Constitutional:      General: He is not in acute distress.    Appearance: Normal appearance. He is normal weight. He is not ill-appearing, toxic-appearing or diaphoretic.  Cardiovascular:     Rate and Rhythm: Normal rate and regular rhythm.     Heart sounds: Normal heart sounds. No murmur heard.   No friction rub. No gallop.  Pulmonary:     Effort: Pulmonary effort is normal. No respiratory distress.     Breath sounds: Normal breath sounds. No stridor. No wheezing, rhonchi or  rales.  Chest:     Chest wall: No tenderness.  Neurological:     General: No focal deficit present.     Mental Status: He is alert and oriented to person, place, and time. Mental status is at baseline.  Psychiatric:        Mood and Affect: Mood normal.        Behavior: Behavior normal.        Thought Content: Thought content normal.        Judgment: Judgment normal.    BP (!) 146/90    Pulse 87    Temp 98.4 F (36.9 C)    Resp 16    Ht 5\' 11"  (1.803 m)    Wt 204 lb (92.5 kg)    SpO2 98%    BMI 28.45 kg/m  Wt Readings from Last 3 Encounters:  05/11/21 204 lb (92.5 kg)  05/03/21 202 lb 13.2 oz (92 kg)  01/03/20 200 lb (90.7 kg)     Health Maintenance Due  Topic Date Due   Hepatitis C Screening  Never done   TETANUS/TDAP  Never done   COLONOSCOPY (Pts 45-90yrs Insurance coverage will need to be confirmed)  Never done    There are no preventive care reminders to display for this patient.  Lab Results  Component Value Date   TSH 1.478 05/03/2021   Lab Results  Component Value Date   WBC 9.1 05/02/2021   HGB 17.7 (H) 05/02/2021   HCT 52.0 05/02/2021   MCV 94.1 05/02/2021   PLT 240 05/02/2021   Lab Results  Component Value Date   NA 138 05/02/2021   K 3.8 05/02/2021   CO2 26 05/02/2021   GLUCOSE 103 (H) 05/02/2021   BUN 15 05/02/2021   CREATININE 0.90 05/02/2021   BILITOT 1.5 (H) 05/02/2021   ALKPHOS 67 05/02/2021   AST 25 05/02/2021   ALT 34 05/02/2021   PROT 7.3 05/02/2021   ALBUMIN 4.4 05/02/2021   CALCIUM 9.1 05/02/2021   ANIONGAP 11 05/02/2021   Lab Results  Component Value Date   CHOL 164 05/03/2021   Lab Results  Component Value Date   HDL 46 05/03/2021   Lab Results  Component Value Date   LDLCALC 100 (H) 05/03/2021   Lab Results  Component Value Date   TRIG 92 05/03/2021   Lab Results  Component Value Date   CHOLHDL 3.6 05/03/2021   Lab Results  Component Value Date   HGBA1C 5.2 05/03/2021      Assessment & Plan:   Problem  List Items Addressed This Visit       Cardiovascular  and Mediastinum   Essential hypertension   Relevant Medications   telmisartan-hydrochlorothiazide (MICARDIS HCT) 40-12.5 MG tablet   Other Relevant Orders   CBC with Differential/Platelet   Comprehensive metabolic panel   Vitamin D (25 hydroxy)   Acute ischemic stroke (HCC) - Primary   Relevant Medications   telmisartan-hydrochlorothiazide (MICARDIS HCT) 40-12.5 MG tablet   Other Relevant Orders   CBC with Differential/Platelet   Comprehensive metabolic panel   Vitamin D (25 hydroxy)    Meds ordered this encounter  Medications   telmisartan-hydrochlorothiazide (MICARDIS HCT) 40-12.5 MG tablet    Sig: Take 1 tablet by mouth daily.    Dispense:  90 tablet    Refill:  0    Order Specific Question:   Supervising Provider    Answer:   Carlota Raspberry, JEFFREY R [[id number]]    Follow-up: Return if symptoms worsen or fail to improve, for as scheduled.[name] Kitchen   PLAN Start telmisartan-hctz as above Return as shceduled Labs collected. Will follow up with the patient as warranted. Follow up with PT/OT and neuro as scheduled Patient encouraged to call clinic with any questions, comments, or concerns.  Maximiano Coss, NP

## 2021-05-12 ENCOUNTER — Encounter: Payer: Self-pay | Admitting: Physical Therapy

## 2021-05-12 ENCOUNTER — Other Ambulatory Visit: Payer: Self-pay

## 2021-05-12 ENCOUNTER — Ambulatory Visit: Payer: 59 | Attending: Family Medicine | Admitting: Physical Therapy

## 2021-05-12 ENCOUNTER — Ambulatory Visit: Payer: 59 | Admitting: Occupational Therapy

## 2021-05-12 ENCOUNTER — Encounter: Payer: Self-pay | Admitting: Occupational Therapy

## 2021-05-12 DIAGNOSIS — R262 Difficulty in walking, not elsewhere classified: Secondary | ICD-10-CM | POA: Insufficient documentation

## 2021-05-12 DIAGNOSIS — R202 Paresthesia of skin: Secondary | ICD-10-CM

## 2021-05-12 DIAGNOSIS — R2681 Unsteadiness on feet: Secondary | ICD-10-CM | POA: Diagnosis present

## 2021-05-12 DIAGNOSIS — M6281 Muscle weakness (generalized): Secondary | ICD-10-CM

## 2021-05-12 DIAGNOSIS — I639 Cerebral infarction, unspecified: Secondary | ICD-10-CM | POA: Diagnosis not present

## 2021-05-12 DIAGNOSIS — R278 Other lack of coordination: Secondary | ICD-10-CM | POA: Diagnosis present

## 2021-05-12 NOTE — Therapy (Signed)
[facility] [address] [place], [place], [address] Phone: [phone]   Fax:  [phone]  Physical Therapy Evaluation  Patient Details  Name: Dionne BucyGary W Windom MRN: [id number] Date of Birth: 01/11/1966 Referring Provider (PT): Tyrone NineGrunz, Ryan B   Encounter Date: 05/12/2021   PT End of Session - 05/12/21 1007     Visit Number 1    Number of Visits 8    Date for PT Re-Evaluation 06/09/21    PT Start Time 0929    PT Stop Time 1007    PT Time Calculation (min) 38 min    Activity Tolerance Patient tolerated treatment well    Behavior During Therapy [facility] for tasks assessed/performed             Past Medical History:  Diagnosis Date   HTN (hypertension)     History reviewed. No pertinent surgical history.  There were no vitals filed for this visit.    Subjective Assessment - 05/12/21 0935     Subjective Pontie stroke, he experienced L sided numbness and tingling. He returned home on Monday, has not noted any difficulty at home, other than the tingling and numbness both getting better and worse at times. he has not noted any difficulty with mobility or strength. He reports that his balance appears mildly decreased especially at night. he has noted this overall recently, but maybe more since the stroke. He also reports that his L calf has been prone to cramping since his stroke.    Pertinent History HTN    How long can you sit comfortably? 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    How long can you walk comfortably? He has not noted any limits.    Patient Stated Goals Patient is not sure what to expect from therapy.    Currently in Pain? No/denies                [facility] PT Assessment - 05/12/21 0001       Assessment   Medical Diagnosis Pontine stroke    Referring Provider (PT) Hazeline JunkerGrunz, Ryan B    Onset Date/Surgical Date 05/04/21      Balance Screen   Has the patient fallen in the past 6 months No       Home Environment   Living Environment Private residence    Living Arrangements Alone    Available Help at Discharge Friend(s)    Type of Home House    Home Access Stairs to enter    Entrance Stairs-Number of Steps 2    Entrance Stairs-Rails Cannot reach both    Home Layout One level      Prior Function   Vocation Part time employment    Vocation Requirements Drives a delivery truck    Leisure Read, TV      Cognition   Overall Cognitive Status Within Functional Limits for tasks assessed      ROM / Strength   AROM / PROM / Strength AROM;Strength      AROM   Overall AROM Comments WFL, B DF mildly tight.      Ambulation/Gait   Gait Comments Gait appears WNL, but he reports unsteadiness at times, especially at night.      Standardized Balance Assessment   Standardized Balance Assessment Dynamic Gait Index;Berg Balance Test      Berg Balance Test   Sit to Stand Able to stand without using hands and stabilize independently    Standing  Unsupported Able to stand safely 2 minutes    Sitting with Back Unsupported but Feet Supported on Floor or Stool Able to sit safely and securely 2 minutes    Stand to Sit Sits safely with minimal use of hands    Transfers Able to transfer safely, minor use of hands    Standing Unsupported with Eyes Closed Able to stand 10 seconds with supervision    Standing Unsupported with Feet Together Able to place feet together independently and stand 1 minute safely    From Standing, Reach Forward with Outstretched Arm Can reach confidently >25 cm (10")    From Standing Position, Pick up Object from Floor Able to pick up shoe safely and easily    From Standing Position, Turn to Look Behind Over each Shoulder Looks behind from both sides and weight shifts well    Turn 360 Degrees Able to turn 360 degrees safely in 4 seconds or less    Standing Unsupported, Alternately Place Feet on Step/Stool Able to stand independently and safely and complete 8 steps in 20  seconds    Standing Unsupported, One Foot in Front Able to place foot tandem independently and hold 30 seconds    Standing on One Leg Able to lift leg independently and hold 5-10 seconds    Total Score 54      Dynamic Gait Index   Level Surface Normal    Change in Gait Speed Normal    Gait with Horizontal Head Turns Normal    Gait with Vertical Head Turns Normal    Gait and Pivot Turn Normal    Step Over Obstacle Normal    Step Around Obstacles Normal    Steps Normal    Total Score 24    DGI comment: No issues.                        Objective measurements completed on examination: See above findings.                PT Education - 05/12/21 1007     Education Details POC    Person(s) Educated Patient    Methods Explanation    Comprehension Verbalized understanding              PT Short Term Goals - 05/12/21 1013       PT SHORT TERM GOAL #1   Title See LTG due to ELS.               PT Long Term Goals - 05/12/21 1013       PT LONG TERM GOAL #1   Title Patient will demosntrate SLS on either limb x at least 30 seconds, including dynamic movements.    Baseline SLS 5-10 seconds.    Time 4    Period Weeks    Status New    Target Date 06/09/21      PT LONG TERM GOAL #2   Title I with HEP for balance, strength, functional mobility.    Baseline Not initiated.    Time 4    Period Weeks    Status New    Target Date 06/09/21                    Plan - 05/12/21 1008     Clinical Impression Statement Patient presents with high level balance deficits including unsteadiness in SLS and with eyes closed. he also reports mild unsteadiness, especially at night and  general stiffness. He will benefit from PT to establish HEP for balance, strength, and functional mobility to minimize fall risk.    Personal Factors and Comorbidities Comorbidity 1    Comorbidities [place] CVA    Examination-Activity Limitations Locomotion Level     Examination-Participation Restrictions Occupation;Other    Stability/Clinical Decision Making Stable/Uncomplicated    Clinical Decision Making Low    Rehab Potential Good    PT Frequency 2x / week    PT Duration 4 weeks    PT Treatment/Interventions ADLs/Self Care Home Management;Therapeutic activities;Functional mobility training;Gait training;Neuromuscular re-education;Balance training;Therapeutic exercise    PT Next Visit Plan Initiate HEP emphasizing SLS, balance, trunk and LE strength, ROM.    Consulted and Agree with Plan of Care Patient             Patient will benefit from skilled therapeutic intervention in order to improve the following deficits and impairments:  Difficulty walking, Decreased endurance, Increased muscle spasms, Decreased balance, Decreased strength, Impaired flexibility  Visit Diagnosis: Unsteadiness on feet  Muscle weakness (generalized)  Difficulty in walking, not elsewhere classified     Problem List Patient Active Problem List   Diagnosis Date Noted   Alcohol use 05/03/2021   Acute ischemic stroke (HCC) 05/02/2021   Essential hypertension 01/10/2020    Iona Beard, DPT 05/12/2021, 10:22 AM  [facility] [address] [place], [place], [address] Phone: [phone]   Fax:  [phone]  Name: TYSHAWN CIULLO MRN: [id number] Date of Birth: May 30, 1965

## 2021-05-13 ENCOUNTER — Telehealth: Payer: Self-pay | Admitting: Hematology

## 2021-05-13 NOTE — Telephone Encounter (Signed)
Scheduled appt per 1/17 referral. Pt is aware of appt date and time. Pt is aware to arrive 15 mins prior to appt time.  °

## 2021-05-15 NOTE — Therapy (Signed)
[facility] [address] [place], [place], [address] Phone: [phone]   Fax:  [phone]  Occupational Therapy Evaluation  Patient Details  Name: Cameron Herrera MRN: [id number] Date of Birth: 05-Oct-1965 Referring Provider (OT): Hazeline Junker, MD   Encounter Date: 05/12/2021   OT End of Session - 05/12/21 1021      Visit Number 1   Number of Visits 1    Authorization Type [employer] ($25/day)    Authorization Time Period VL: 60 (combined)   OT Start Time 1010   OT Stop Time 1055   OT Time Calculation (min) 45   Activity Tolerance Patient tolerated treatment well   Behavior During Therapy [facility] for tasks assessed/performed           Past Medical History:  Diagnosis Date   HTN (hypertension)     History reviewed. No pertinent surgical history.  There were no vitals filed for this visit.   Subjective Assessment - 05/12/21 1007      Subjective Pt arrives to session w/ primary concerns related to slightly impacted balance and persistent numbness/tingling that is fairly constant s/p R pons CVA on 05/02/21. Reports he is working on lifestyle management (alcohol moderation, smokeless tobacco cessation) and is not currently experiencing any difficulty w/ functional activities since d/c from hospital on Monday. Denies visual changes since CVA, but does report that "out of the blue my vision went double" about a year and a half ago but improved after a few minutes and has not happened since.   Pertinent History R pons CVA w/ L-sided numbness, FM limitations, and mild visual deficits in L eye (05/02/21); PMH includes uncontrolled HTN, ETOH use, former smoker and smokeless tobacco use   Currently in Pain? No/denies            [facility] OT Assessment - 05/12/21 1013       Assessment   Medical Diagnosis R pons stroke w/ L-sided numbness/tingling   Referring Provider (OT) Hazeline Junker, MD   Onset Date/Surgical Date 05/02/2021   Hand  Dominance Right   Next MD Visit 06/22/21 (hospital follow-up w/ Neurology)   Prior Therapy Acute     Precautions   Precautions None   Precautions Comments HTN management   Balance Screen   Has the patient fallen in the past 6 months No   Home Environment   Type of Home House   Home Layout One level   Lives With Alone    Prior Function   Level of Independence Independent   Vocation Part time employment   Electronics engineer truck parts; retired Garment/textile technologist Work    ADL   ADL comments Independent w/ all BADLs per pt report    IADL   Shopping Takes care of all shopping needs independently   Light Housekeeping Maintains house alone or with occasional assistance   Meal Prep Plans, prepares and serves adequate meals independently   Training and development officer own vehicle   Medication Management Is responsible for taking medication in correct dosages at correct time   Development worker, community financial matters independently (budgets, writes checks, pays rent, bills goes to bank), collects and keeps track of income    Mobility   Mobility Status Comments Reports mild balance deficits; denies falls    Vision - History   Baseline Vision No visual deficits      per pt report    Vision Assessment   Ocular Range of Motion  WFL   Tracking/Visual Pursuits Able to track stimulus in all quads without difficulty   Saccades WFL   Convergence WFL   Visual Fields No apparent deficits   Acuity Able to read clock/calendar on wall without difficulty;Able to read newsprint without difficulty    Cognition   Overall Cognitive Status Within Functional Limits for tasks assessed    Sensation   Light Touch Appears Intact   Hot/Cold Appears Intact    Coordination   Gross Motor Movements are Fluid and Coordinated Yes   Fine Motor Movements are Fluid and Coordinated Yes   Finger Nose Finger Test [place]   9 Hole Peg Test Right;Left   Right 9 Hole Peg Test 23 sec   Left 9  Hole Peg Test 34 sec    ROM / Strength   AROM / PROM / Strength AROM;Strength     AROM   Overall AROM Within functional limits for tasks performed   AROM Assessment Site Shoulder;Elbow;Forearm;Wrist   Strength   Overall Strength Within functional limits for tasks performed   Strength Assessment Site Shoulder;Elbow;Forearm   Hand Function    Right Hand Grip (lbs) 105 lbs   Left Hand Grip (lbs) 96 lbs            OT Education - 05/12/21 1026      Education Details Education provided on role and purpose of OT, as well as interpretation of initial evaluation findings. Reviewed and administered handout for sensory safety strategies and examples of coordination activities pt is able to complete at home, providing additional education regarding principles of NMR and sensory reeducation.   Person(s) Education Patient   Methods Explanation;Demonstration;Handout   Comprehension Verbalized understanding;Returned demonstration            OT Short Term Goals - 05/12/21 1041       OT SHORT TERM GOAL #1   Title Pt will verbalize understanding of sensory safety strategies due to diminished L hand sensation and reported numbness    Time 1    Period Days    Status Achieved   Handout provided   Target Date 05/12/21      OT SHORT TERM GOAL #2   Title Pt will demonstrate understanding of examples of FM tasks and coordination activities to facilitate return of sensation and improved FMC during bilateral activities    Time 1    Period Days    Status Achieved   Pt returned demo; handout provided   Target Date 05/12/21             Plan - 05/12/21 1031      Clinical Impression Statement Pt is a 56 y/o male who presents to OP OT s/p R pons stroke w/ residual L-sided numbness on 05/02/21. PHM includes previously uncontrolled HTN, prior ETOH use, former smoker and smokeless tobacco use. 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 Due to pt's report of completing functional activities independently, mild nature of deficits, and referral for PT services to address generalized weakness/balance, skilled OP occupational therapy services are not warranted at this time. OT discussed this w/ pt who was receptive and provided pertinent education and answered pt questions as able. Pt encouraged to call back with any specific changes or development of limitations during functional activities, as well as to continue follow-up with physician(s), as needed.   OT Occupational Profile and History Detailed Assessment- Review of Records and additional review of  physical, cognitive, psychosocial history related to current functional performance   Occupational performance deficits (Please refer to evaluation for details): Work;Leisure   Body Structure / Function / Physical Skills Dexterity;Sensation;Coordination;FMC;Decreased knowledge of precautions;Balance;IADL   Clinical Decision Making Limited treatment options, no task modification necessary   Comorbidities Affecting Occupational Performance: May have comorbidities impacting occupational performance   Modification or Assistance to Complete Evaluation No modification of tasks or assist necessary to complete eval   OT Frequency One time visit   OT Treatment/Interventions Patient/family education;Neuromuscular education   Plan Skilled OT services are not warranted at this time; pt encouraged to call back with any changes in functional status or limitations.   Consulted and Agree with Plan of Care Patient           Patient will benefit from skilled therapeutic intervention in order to improve the following deficits and impairments:   Body Structure / Function / Physical Skills: Dexterity, Sensation, Coordination, FMC, Decreased knowledge of precautions   Visit Diagnosis: Paresthesia of skin  Other lack of coordination  Muscle weakness  (generalized)   Problem List Patient Active Problem List   Diagnosis Date Noted   Alcohol use 05/03/2021   Acute ischemic stroke (HCC) 05/02/2021   Essential hypertension 01/10/2020    Rosie FateKelsey , MSOT, OTR/L  05/12/2021, 3:22 PM  [facility] [address] [place], [place], [address] Phone: [phone]   Fax:  [phone]  Name: Cameron Herrera MRN: [id number] Date of Birth: 02/16/1966

## 2021-05-19 ENCOUNTER — Ambulatory Visit: Payer: 59 | Admitting: Physical Therapy

## 2021-05-19 ENCOUNTER — Encounter: Payer: Self-pay | Admitting: Physical Therapy

## 2021-05-19 ENCOUNTER — Other Ambulatory Visit: Payer: Self-pay

## 2021-05-19 DIAGNOSIS — R202 Paresthesia of skin: Secondary | ICD-10-CM

## 2021-05-19 DIAGNOSIS — R2681 Unsteadiness on feet: Secondary | ICD-10-CM | POA: Diagnosis not present

## 2021-05-19 DIAGNOSIS — R262 Difficulty in walking, not elsewhere classified: Secondary | ICD-10-CM

## 2021-05-19 DIAGNOSIS — M6281 Muscle weakness (generalized): Secondary | ICD-10-CM

## 2021-05-19 DIAGNOSIS — R278 Other lack of coordination: Secondary | ICD-10-CM

## 2021-05-19 NOTE — Patient Instructions (Signed)
Access Code: [email or web address] URL: [email or web address] Date: 05/19/2021 Prepared by: Oley Balm  Exercises Standing Soleus Stretch on Step - 1 x daily - 7 x weekly - 2 sets - 10 reps Gastroc Stretch on Step - 1 x daily - 7 x weekly - 2 sets - 10 reps Eccentric Heel Lowering on Step - 1 x daily - 7 x weekly - 2 sets - 10 reps Supine Hamstring Stretch with Strap - 1 x daily - 7 x weekly - 3 sets - 15 hold Supine ITB Stretch with Strap - 1 x daily - 7 x weekly - 3 sets - 15 hold Hip Adductors and Hamstring Stretch with Strap - 1 x daily - 7 x weekly - 3 sets - 15 hold Supine Figure 4 Piriformis Stretch - 1 x daily - 7 x weekly - 3 sets - 15 hold

## 2021-05-19 NOTE — Therapy (Signed)
[facility] [address] [place], [place], [address] Phone: [phone]   Fax:  [phone]  Physical Therapy Treatment  Patient Details  Name: Cameron Herrera MRN: [id number] Date of Birth: 06-Sep-1965 Referring Provider (PT): Tyrone Nine   Encounter Date: 05/19/2021   PT End of Session - 05/19/21 0929     Visit Number 2    Number of Visits 8    Date for PT Re-Evaluation 06/09/21    PT Start Time 0847    PT Stop Time 0928    PT Time Calculation (min) 41 min    Activity Tolerance Patient tolerated treatment well    Behavior During Therapy [facility] for tasks assessed/performed             Past Medical History:  Diagnosis Date   HTN (hypertension)     History reviewed. No pertinent surgical history.  There were no vitals filed for this visit.   Subjective Assessment - 05/19/21 0850     Subjective Patient reports he felt tired last week, improved this week. His L calf feels tight, more so than the R side. Still feels more unsteady at night.    Currently in Pain? No/denies                               [facility] Adult PT Treatment/Exercise - 05/19/21 0001       Exercises   Exercises Knee/Hip;Ankle      Knee/Hip Exercises: Aerobic   Elliptical L2 x 4 minutes.      Ankle Exercises: Stretches   Soleus Stretch 3 reps;10 seconds    Gastroc Stretch 3 reps;10 seconds      Ankle Exercises: Plyometrics   Plyometric Exercises side to side shuffle over a line on the floor. 2 x 10 reps.      Ankle Exercises: Standing   Rocker Board 2 minutes    Rocker Board Limitations Upside down BOSU. Worked iwth BLE, ant/post, laterall, then in circles in both directions.    Heel Raises Both;15 reps;4 seconds    Heel Raises Limitations Up on two feet, down slowly on one..Paitent reports more difficulty with LLE.                     PT Education - 05/19/21 0929     Education Details HEP, slow down.     Person(s) Educated Patient    Methods Explanation;Demonstration;Handout    Comprehension Returned demonstration;Verbalized understanding              PT Short Term Goals - 05/12/21 1013       PT SHORT TERM GOAL #1   Title See LTG due to ELS.               PT Long Term Goals - 05/12/21 1013       PT LONG TERM GOAL #1   Title Patient will demosntrate SLS on either limb x at least 30 seconds, including dynamic movements.    Baseline SLS 5-10 seconds.    Time 4    Period Weeks    Status New    Target Date 06/09/21      PT LONG TERM GOAL #2   Title I with HEP for balance, strength, functional mobility.    Baseline Not initiated.    Time 4    Period Weeks    Status New    Target  Date 06/09/21                   Plan - 05/19/21 0929     Clinical Impression Statement Patient reports L calf tightness and spasm. It was weaker in testing as well. Patient performed heel cord strengthening and stretch, educated patient to self massage of L calf. Also performed plyometric exercises for LE strength.    Personal Factors and Comorbidities Comorbidity 1    Comorbidities [place] CVA    Examination-Activity Limitations Locomotion Level    Examination-Participation Restrictions Occupation;Other    Stability/Clinical Decision Making Stable/Uncomplicated    Clinical Decision Making Low    Rehab Potential Good    PT Frequency 2x / week    PT Duration 3 weeks    PT Treatment/Interventions ADLs/Self Care Home Management;Therapeutic activities;Functional mobility training;Gait training;Neuromuscular re-education;Balance training;Therapeutic exercise    PT Next Visit Plan Initiate HEP emphasizing SLS, balance, trunk and LE strength, ROM.    PT Home Exercise Plan [email or web address]    Consulted and Agree with Plan of Care Patient             Patient will benefit from skilled therapeutic intervention in order to improve the following deficits and impairments:  Difficulty walking,  Decreased endurance, Increased muscle spasms, Decreased balance, Decreased strength, Impaired flexibility  Visit Diagnosis: Paresthesia of skin  Other lack of coordination  Muscle weakness (generalized)  Unsteadiness on feet  Difficulty in walking, not elsewhere classified     Problem List Patient Active Problem List   Diagnosis Date Noted   Alcohol use 05/03/2021   Acute ischemic stroke (HCC) 05/02/2021   Essential hypertension 01/10/2020    Iona Beard, DPT 05/19/2021, 9:32 AM  [facility] [address] [place], [place], [address] Phone: [phone]   Fax:  [phone]  Name: Cameron Herrera MRN: [id number] Date of Birth: Jun 22, 1965

## 2021-05-21 ENCOUNTER — Other Ambulatory Visit: Payer: Self-pay

## 2021-05-21 ENCOUNTER — Ambulatory Visit: Payer: 59 | Admitting: Physical Therapy

## 2021-05-21 ENCOUNTER — Encounter: Payer: Self-pay | Admitting: Physical Therapy

## 2021-05-21 DIAGNOSIS — R278 Other lack of coordination: Secondary | ICD-10-CM

## 2021-05-21 DIAGNOSIS — R202 Paresthesia of skin: Secondary | ICD-10-CM

## 2021-05-21 DIAGNOSIS — R2681 Unsteadiness on feet: Secondary | ICD-10-CM | POA: Diagnosis not present

## 2021-05-21 DIAGNOSIS — M6281 Muscle weakness (generalized): Secondary | ICD-10-CM

## 2021-05-21 DIAGNOSIS — R262 Difficulty in walking, not elsewhere classified: Secondary | ICD-10-CM

## 2021-05-21 NOTE — Therapy (Signed)
[facility] [place] [facility] [address] [place], [place], [address] Phone: [phone]   Fax:  [phone]  Physical Therapy Treatment  Patient Details  Name: Cameron Herrera MRN: [id number] Date of Birth: 1965-05-28 Referring Provider (PT): Tyrone Nine   Encounter Date: 05/21/2021   PT End of Session - 05/21/21 1155     Visit Number 3    Number of Visits 8    Date for PT Re-Evaluation 06/09/21    PT Start Time 1020    PT Stop Time 1100    PT Time Calculation (min) 40 min    Activity Tolerance Patient tolerated treatment well    Behavior During Therapy [facility] for tasks assessed/performed             Past Medical History:  Diagnosis Date   HTN (hypertension)     History reviewed. No pertinent surgical history.  There were no vitals filed for this visit.   Subjective Assessment - 05/21/21 1021     Subjective I'm doing well, can't complain. Sometimes it feels like L sided tingling increases and L UE heaviness also increases, lasts about 10 minutes then goes away. Motor skills are OK. Just that tingling/numbness is the main concern- feels like its going to sleep or waking up. I do have some trouble with balance but not all the time, more when I get up at night but this is mostly when I first get up. Once I'm oriented I feel OK.    Pertinent History HTN    Currently in Pain? No/denies                               [facility] Adult PT Treatment/Exercise - 05/21/21 0001       Knee/Hip Exercises: Stretches   Gastroc Stretch 2 reps;60 seconds;Right;Left   30 seconds R LE, 60 seconds LLE   Other Knee/Hip Stretches rockerboard ankle PF/DF with 3-5 second holds x20                 Balance Exercises - 05/21/21 0001       Balance Exercises: Standing   Standing Eyes Closed Foam/compliant surface;2 reps;30 secs   physiodiscs 30 seconds x3   Tandem Stance Eyes open;Foam/compliant surface;3 reps;30 secs    SLS Eyes  open;Foam/compliant surface;3 reps;10 secs    Gait with Head Turns Forward;2 reps   horizontal and vertical turns   Tandem Gait Forward;Retro;Foam/compliant surface;4 reps    Retro Gait 2 reps    Other Standing Exercises braiding, sidestep and 180 degree turn    Other Standing Exercises Comments step ups on BOSU 1x10 L LE leading                PT Education - 05/21/21 1154     Education Details exercise form, potential POC moving forward; impossible to predict if numbness after CVA will 100% resolve or not    Person(s) Educated Patient    Methods Explanation    Comprehension Verbalized understanding              PT Short Term Goals - 05/12/21 1013       PT SHORT TERM GOAL #1   Title See LTG due to ELS.               PT Long Term Goals - 05/12/21 1013       PT LONG TERM GOAL #1   Title  Patient will demosntrate SLS on either limb x at least 30 seconds, including dynamic movements.    Baseline SLS 5-10 seconds.    Time 4    Period Weeks    Status New    Target Date 06/09/21      PT LONG TERM GOAL #2   Title I with HEP for balance, strength, functional mobility.    Baseline Not initiated.    Time 4    Period Weeks    Status New    Target Date 06/09/21                   Plan - 05/21/21 1155     Clinical Impression Statement Mr. Gubser arrives today doing well, still concerned about that numbness on his left side after CVA. Educated that its difficult to predict rate of recovery of each individual stroke. Otherwise we focused on balance/proprioception and L ankle mobility today. Seems to be doing quite well overall. Does have some concern about his balance in general after the CVA but also tells me Im not sure if its significantly different to how I was pre-stroke. May actually be ready for DC within another couple sessions, will discuss with evaluating PT.    Personal Factors and Comorbidities Comorbidity 1    Comorbidities [place] CVA     Examination-Activity Limitations Locomotion Level    Examination-Participation Restrictions Occupation;Other    Stability/Clinical Decision Making Stable/Uncomplicated    Clinical Decision Making Low    Rehab Potential Good    PT Frequency 2x / week    PT Duration 3 weeks    PT Treatment/Interventions ADLs/Self Care Home Management;Therapeutic activities;Functional mobility training;Gait training;Neuromuscular re-education;Balance training;Therapeutic exercise    PT Next Visit Plan Initiate HEP emphasizing SLS, balance, trunk and LE strength, ROM.    PT Home Exercise Plan [id number]    Consulted and Agree with Plan of Care Patient             Patient will benefit from skilled therapeutic intervention in order to improve the following deficits and impairments:  Difficulty walking, Decreased endurance, Increased muscle spasms, Decreased balance, Decreased strength, Impaired flexibility  Visit Diagnosis: Paresthesia of skin  Other lack of coordination  Muscle weakness (generalized)  Unsteadiness on feet  Difficulty in walking, not elsewhere classified     Problem List Patient Active Problem List   Diagnosis Date Noted   Alcohol use 05/03/2021   Acute ischemic stroke (HCC) 05/02/2021   Essential hypertension 01/10/2020   Lerry Liner PT, DPT, PN2   Supplemental Physical Therapist [facility]    Pager [phone] Acute Rehab Office [phone]   [facility] [place] [facility] [address] [place], [place], [address] Phone: [phone]   Fax:  [phone]  Name: Cameron Herrera MRN: [id number] Date of Birth: August 22, 1965

## 2021-05-26 ENCOUNTER — Other Ambulatory Visit: Payer: Self-pay

## 2021-05-26 ENCOUNTER — Ambulatory Visit: Payer: 59 | Admitting: Physical Therapy

## 2021-05-26 ENCOUNTER — Encounter: Payer: Self-pay | Admitting: Physical Therapy

## 2021-05-26 DIAGNOSIS — R278 Other lack of coordination: Secondary | ICD-10-CM

## 2021-05-26 DIAGNOSIS — M6281 Muscle weakness (generalized): Secondary | ICD-10-CM

## 2021-05-26 DIAGNOSIS — R202 Paresthesia of skin: Secondary | ICD-10-CM

## 2021-05-26 DIAGNOSIS — R262 Difficulty in walking, not elsewhere classified: Secondary | ICD-10-CM

## 2021-05-26 DIAGNOSIS — R2681 Unsteadiness on feet: Secondary | ICD-10-CM | POA: Diagnosis not present

## 2021-05-26 NOTE — Therapy (Signed)
[facility] [place] [facility] [address] [place], [place], [address] Phone: [phone]   Fax:  [phone]  Physical Therapy Treatment  Patient Details  Name: Cameron Herrera MRN: [id number] Date of Birth: 06/20/1965 Referring Provider (PT): Tyrone Nine   Encounter Date: 05/26/2021   PT End of Session - 05/26/21 0931     Visit Number 4    Number of Visits 8    Date for PT Re-Evaluation 05/28/21    PT Start Time 0848    PT Stop Time 0929    PT Time Calculation (min) 41 min    Activity Tolerance Patient tolerated treatment well    Behavior During Therapy [facility] for tasks assessed/performed             Past Medical History:  Diagnosis Date   HTN (hypertension)     History reviewed. No pertinent surgical history.  There were no vitals filed for this visit.   Subjective Assessment - 05/26/21 0849     Subjective Feeling good, just usual numbness    Pertinent History HTN    Currently in Pain? No/denies                               [facility] Adult PT Treatment/Exercise - 05/26/21 0001       Knee/Hip Exercises: Stretches   Gastroc Stretch 2 reps;60 seconds    Other Knee/Hip Stretches rockerboard ankle PF/DF with 3-5 second holds x20      Knee/Hip Exercises: Aerobic   Elliptical L3 x7 min for ankle ROM/mechanics                 Balance Exercises - 05/26/21 0001       Balance Exercises: Standing   SLS Eyes open;Foam/compliant surface;3 reps;10 secs    Other Standing Exercises step ups on BOSU 1x10 B; heel raises 1x15 on foam pad B; toe raises 1x15 B foam pad    Other Standing Exercises Comments lateral steps up onto then over BOSU 1x10 B; 3 way sls taps 1x10 B on foam pad; cone taps with sls on foam pad horizontal and vertical B LEs                PT Education - 05/26/21 0930     Education Details DC next session, really doing well and very functiona/low fall risk    Person(s) Educated Patient     Methods Explanation    Comprehension Verbalized understanding              PT Short Term Goals - 05/12/21 1013       PT SHORT TERM GOAL #1   Title See LTG due to ELS.               PT Long Term Goals - 05/12/21 1013       PT LONG TERM GOAL #1   Title Patient will demosntrate SLS on either limb x at least 30 seconds, including dynamic movements.    Baseline SLS 5-10 seconds.    Time 4    Period Weeks    Status New    Target Date 06/09/21      PT LONG TERM GOAL #2   Title I with HEP for balance, strength, functional mobility.    Baseline Not initiated.    Time 4    Period Weeks    Status New    Target Date 06/09/21  Plan - 05/26/21 0931     Clinical Impression Statement Mr. [name] arrives today doing well, we started with elliptical for ankle ROM/mechanics, then worked on some stretches and focused on balance/single leg tasks today. He really seems to be doing quite well with no major complaints other than the numbness in his leg. Assuming this numbness is related to his CVA, theres not a lot we can do to resolve this numbness in PT, more likely will be a matter of time. Did seem a little more externally and internally distracted today.  He is agreeable to DC next session since he is really doing quite well.    Personal Factors and Comorbidities Comorbidity 1    Comorbidities [place] CVA    Examination-Activity Limitations Locomotion Level    Examination-Participation Restrictions Occupation;Other    Stability/Clinical Decision Making Stable/Uncomplicated    Clinical Decision Making Low    Rehab Potential Good    PT Frequency 2x / week    PT Treatment/Interventions ADLs/Self Care Home Management;Therapeutic activities;Functional mobility training;Gait training;Neuromuscular re-education;Balance training;Therapeutic exercise    PT Next Visit Plan DC, needs advanced HEP including ankle strengthening    PT Home Exercise Plan [email or web address]     Consulted and Agree with Plan of Care Patient             Patient will benefit from skilled therapeutic intervention in order to improve the following deficits and impairments:  Difficulty walking, Decreased endurance, Increased muscle spasms, Decreased balance, Decreased strength, Impaired flexibility  Visit Diagnosis: Paresthesia of skin  Other lack of coordination  Muscle weakness (generalized)  Unsteadiness on feet  Difficulty in walking, not elsewhere classified     Problem List Patient Active Problem List   Diagnosis Date Noted   Alcohol use 05/03/2021   Acute ischemic stroke (HCC) 05/02/2021   Essential hypertension 01/10/2020   Lerry Liner PT, DPT, PN2   Supplemental Physical Therapist        [facility] [place] [facility] [address] [place], [place], [address] Phone: [phone]   Fax:  [phone]  Name: Cameron Herrera MRN: [id number] Date of Birth: Jun 14, 1965

## 2021-05-28 ENCOUNTER — Other Ambulatory Visit: Payer: Self-pay

## 2021-05-28 ENCOUNTER — Ambulatory Visit: Payer: 59 | Attending: Family Medicine | Admitting: Physical Therapy

## 2021-05-28 ENCOUNTER — Encounter: Payer: Self-pay | Admitting: Physical Therapy

## 2021-05-28 DIAGNOSIS — R262 Difficulty in walking, not elsewhere classified: Secondary | ICD-10-CM | POA: Diagnosis present

## 2021-05-28 DIAGNOSIS — R2681 Unsteadiness on feet: Secondary | ICD-10-CM | POA: Diagnosis present

## 2021-05-28 DIAGNOSIS — M6281 Muscle weakness (generalized): Secondary | ICD-10-CM | POA: Insufficient documentation

## 2021-05-28 DIAGNOSIS — R202 Paresthesia of skin: Secondary | ICD-10-CM | POA: Insufficient documentation

## 2021-05-28 DIAGNOSIS — R278 Other lack of coordination: Secondary | ICD-10-CM | POA: Insufficient documentation

## 2021-05-28 NOTE — Patient Instructions (Signed)
Access Code: [email or web address] URL: [email or web address] Date: 05/28/2021 Prepared by: Oley Balm  Exercises Standing Soleus Stretch on Step - 1 x daily - 7 x weekly - 2 sets - 10 reps Gastroc Stretch on Step - 1 x daily - 7 x weekly - 2 sets - 10 reps Eccentric Heel Lowering on Step - 1 x daily - 7 x weekly - 2 sets - 10 reps Supine Hamstring Stretch with Strap - 1 x daily - 7 x weekly - 3 sets - 15 hold Supine ITB Stretch with Strap - 1 x daily - 7 x weekly - 3 sets - 15 hold Hip Adductors and Hamstring Stretch with Strap - 1 x daily - 7 x weekly - 3 sets - 15 hold Supine Figure 4 Piriformis Stretch - 1 x daily - 7 x weekly - 3 sets - 15 hold Standing 3-Way Leg Reach with Resistance at Ankles and Counter Support - 1 x daily - 7 x weekly - 2 sets - 10 reps Single Leg Stance - 1 x daily - 7 x weekly - 3 sets - 30 sec hold

## 2021-05-28 NOTE — Therapy (Signed)
[place] [place], [place], [address] Phone: [phone]   Fax:  [phone]  Physical Therapy Treatment PHYSICAL THERAPY DISCHARGE SUMMARY  Visits from Start of Care: 5  Current functional level related to goals / functional outcomes: See functional re-assessment.   Remaining deficits: Numbness LLE.   Education / Equipment: HEP   Patient agrees to discharge. Patient goals were met. Patient is being discharged due to meeting the stated rehab goals.  Patient Details  Name: MASASHI SNOWDON MRN: [id number] Date of Birth: 07-05-1965 Referring Provider (PT): Patrecia Pour   Encounter Date: 05/28/2021   PT End of Session - 05/28/21 0915     Visit Number 5    Number of Visits 8    Date for PT Re-Evaluation 05/28/21    PT Start Time 2376    PT Stop Time 0916    PT Time Calculation (min) 29 min    Activity Tolerance Patient tolerated treatment well    Behavior During Therapy [facility] for tasks assessed/performed             Past Medical History:  Diagnosis Date   HTN (hypertension)     History reviewed. No pertinent surgical history.  There were no vitals filed for this visit.   Subjective Assessment - 05/28/21 0848     Subjective Numbness remains, it may be impeding his balance. 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    Pertinent History HTN    How long can you sit comfortably? 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    Currently in Pain? No/denies                               [facility] Adult PT Treatment/Exercise - 05/28/21 0001       Knee/Hip Exercises: Standing   Hip Flexion Stengthening;Both;2 sets;10 reps    Hip Flexion Limitations G Tband    Hip Abduction Stengthening;Both;1 set;10 reps    Abduction Limitations G Tband    Hip Extension Stengthening;Both;2 sets;10 reps    Extension Limitations G Tband      Ankle  Exercises: Standing   SLS 30 sec each leg x 3, moving opposite leg, increased difficulty with RL standing.    Other Standing Ankle Exercises Standing on physiodisc, SLS 2 x 30 sec each, required repeated UE support for balance.                     PT Education - 05/28/21 0915     Education Details Updated HEP, stay mobile and active.    Person(s) Educated Patient    Methods Explanation;Demonstration;Handout    Comprehension Verbalized understanding;Returned demonstration              PT Short Term Goals - 05/28/21 0917       PT SHORT TERM GOAL #1   Title See LTG due to ELS.    Status Achieved               PT Long Term Goals - 05/28/21 0917       PT LONG TERM GOAL #1   Title Patient will demosntrate SLS on either limb x at least 30 seconds, including dynamic movements.    Baseline SLS 30 sec or more, occasional UE support in L stance.    Time 4  Period Weeks    Status Achieved    Target Date 06/09/21      PT LONG TERM GOAL #2   Title I with HEP for balance, strength, functional mobility.    Baseline Not initiated.    Time 4    Period Weeks    Status Achieved    Target Date 06/09/21                   Plan - 05/28/21 2904     Clinical Impression Statement Patient feels he is ready for D/C, therapist agrees. HEP updated to include more balance/stability exercises, patient returned demonstration of each. all questions answered.    Personal Factors and Comorbidities Comorbidity 1    Comorbidities [place] CVA    Examination-Activity Limitations Locomotion Level    Examination-Participation Restrictions Occupation;Other    Stability/Clinical Decision Making Stable/Uncomplicated    Clinical Decision Making Low    Rehab Potential Good    PT Frequency 2x / week    PT Treatment/Interventions ADLs/Self Care Home Management;Therapeutic activities;Functional mobility training;Gait training;Neuromuscular re-education;Balance training;Therapeutic  exercise    PT Home Exercise Plan [id number]    Consulted and Agree with Plan of Care Patient             Patient will benefit from skilled therapeutic intervention in order to improve the following deficits and impairments:  Difficulty walking, Decreased endurance, Increased muscle spasms, Decreased balance, Decreased strength, Impaired flexibility  Visit Diagnosis: Paresthesia of skin  Other lack of coordination  Muscle weakness (generalized)  Unsteadiness on feet  Difficulty in walking, not elsewhere classified     Problem List Patient Active Problem List   Diagnosis Date Noted   Alcohol use 05/03/2021   Acute ischemic stroke ([place]) 05/02/2021   Essential hypertension 01/10/2020    Marcelina Morel, DPT 05/28/2021, 9:19 AM  [place]. [place], [place], [address] Phone: [phone]   Fax:  [phone]  Name: LEVONTE MOLINA MRN: [id number] Date of Birth: 1965/10/24

## 2021-06-02 ENCOUNTER — Ambulatory Visit: Payer: 59 | Admitting: Physical Therapy

## 2021-06-02 ENCOUNTER — Other Ambulatory Visit: Payer: Self-pay

## 2021-06-02 ENCOUNTER — Inpatient Hospital Stay: Payer: 59

## 2021-06-02 ENCOUNTER — Inpatient Hospital Stay: Payer: 59 | Attending: Hematology | Admitting: Hematology

## 2021-06-02 VITALS — BP 148/101 | HR 85 | Temp 98.2°F | Resp 20 | Wt 208.3 lb

## 2021-06-02 DIAGNOSIS — D751 Secondary polycythemia: Secondary | ICD-10-CM | POA: Insufficient documentation

## 2021-06-02 LAB — CBC WITH DIFFERENTIAL/PLATELET
Abs Immature Granulocytes: 0.03 10*3/uL (ref 0.00–0.07)
Basophils Absolute: 0 10*3/uL (ref 0.0–0.1)
Basophils Relative: 0 %
Eosinophils Absolute: 0.1 10*3/uL (ref 0.0–0.5)
Eosinophils Relative: 1 %
HCT: 44.9 % (ref 39.0–52.0)
Hemoglobin: 16.6 g/dL (ref 13.0–17.0)
Immature Granulocytes: 0 %
Lymphocytes Relative: 23 %
Lymphs Abs: 1.6 10*3/uL (ref 0.7–4.0)
MCH: 32.5 pg (ref 26.0–34.0)
MCHC: 37 g/dL — ABNORMAL HIGH (ref 30.0–36.0)
MCV: 87.9 fL (ref 80.0–100.0)
Monocytes Absolute: 0.6 10*3/uL (ref 0.1–1.0)
Monocytes Relative: 8 %
Neutro Abs: 4.6 10*3/uL (ref 1.7–7.7)
Neutrophils Relative %: 68 %
Platelets: 202 10*3/uL (ref 150–400)
RBC: 5.11 MIL/uL (ref 4.22–5.81)
RDW: 11.1 % — ABNORMAL LOW (ref 11.5–15.5)
WBC: 6.9 10*3/uL (ref 4.0–10.5)
nRBC: 0 % (ref 0.0–0.2)

## 2021-06-02 LAB — CMP (CANCER CENTER ONLY)
ALT: 48 U/L — ABNORMAL HIGH (ref 0–44)
AST: 23 U/L (ref 15–41)
Albumin: 4.4 g/dL (ref 3.5–5.0)
Alkaline Phosphatase: 104 U/L (ref 38–126)
Anion gap: 9 (ref 5–15)
BUN: 13 mg/dL (ref 6–20)
CO2: 25 mmol/L (ref 22–32)
Calcium: 9 mg/dL (ref 8.9–10.3)
Chloride: 102 mmol/L (ref 98–111)
Creatinine: 0.8 mg/dL (ref 0.61–1.24)
GFR, Estimated: >60 mL/min (ref >60)
Glucose, Bld: 136 mg/dL — ABNORMAL HIGH (ref 70–99)
Potassium: 3.4 mmol/L — ABNORMAL LOW (ref 3.5–5.1)
Sodium: 136 mmol/L (ref 135–145)
Total Bilirubin: 1.5 mg/dL — ABNORMAL HIGH (ref 0.3–1.2)
Total Protein: 6.9 g/dL (ref 6.5–8.1)

## 2021-06-02 LAB — FERRITIN: Ferritin: 972 ng/mL — ABNORMAL HIGH (ref 24–336)

## 2021-06-02 LAB — IRON AND IRON BINDING CAPACITY (CC-WL,HP ONLY)
Iron: 158 ug/dL (ref 45–182)
Saturation Ratios: 55 % — ABNORMAL HIGH (ref 17.9–39.5)
TIBC: 287 ug/dL (ref 250–450)
UIBC: 129 ug/dL (ref 117–376)

## 2021-06-02 NOTE — Progress Notes (Addendum)
[name]   HEMATOLOGY/ONCOLOGY CONSULTATION NOTE  Date of Service: 06/02/2021  Patient Care Team: Maximiano Coss, NP as PCP - General (Adult Health Nurse Practitioner)  CHIEF COMPLAINTS/PURPOSE OF CONSULTATION:  Polycythemia  HISTORY OF PRESENTING ILLNESS:  Cameron Herrera is a wonderful 56 y.o. male who has been referred to [place] by Dr .Maximiano Coss, NP  for evaluation and management of polycythemia  Patient last had labs with his primary care physician on 05/11/2021 which showed hemoglobin of 17.4 with a hematocrit of 51.7 with a WBC count of 7.1k and platelets of 241k.  Review of labs showed that he has had elevated hemoglobin and hematocrit levels since at least September 2021.  Patient notes no chest pain or shortness of breath.  No new leg pain or swelling. No change in vision. No headaches or new focal neurological deficits.  Off smokeless tobacco No smoked tobacco since 2018 No VTE No history of MI CVA 04/2021  ETOH -  2-10 drinks /day vodka/whisky-- cutting down.  HCTZ No sleep apnea.  No testosterone supplementation.   MEDICAL HISTORY:  Past Medical History:  Diagnosis Date   HTN (hypertension)   CVA 2023 - [place]ine  ETOH excess B12 deficiency  SURGICAL HISTORY: -Broken nose -tonsillectomy 2007  SOCIAL HISTORY: Social History   Socioeconomic History   Marital status: Divorced    Spouse name: Not on file   Number of children: Not on file   Years of education: Not on file   Highest education level: Not on file  Occupational History   Not on file  Tobacco Use   Smoking status: Former    Types: Cigarettes    Quit date: 2018    Years since quitting: 5.1   Smokeless tobacco: Current    Types: Chew  Substance and Sexual Activity   Alcohol use: Yes    Comment: 3-4 beers per day + Vodka   Drug use: Never   Sexual activity: Not on file  Other Topics Concern   Not on file  Social History Narrative   Not on file   Social Determinants of Health   Financial  Resource Strain: Not on file  Food Insecurity: Not on file  Transportation Needs: Not on file  Physical Activity: Not on file  Stress: Not on file  Social Connections: Not on file  Intimate Partner Violence: Not on file  Ex firefighter   FAMILY HISTORY: Family History  Problem Relation Age of Onset   Hypertension Mother    Hyperlipidemia Mother    Heart failure Mother    COPD Father        smoker   Diabetes Father    Hypertension Father    Hypertension Brother    Mother - CAD- died young  ALLERGIES:  has No Known Allergies.  MEDICATIONS:  Current Outpatient Medications  Medication Sig Dispense Refill   aspirin EC 81 MG EC tablet Take 1 tablet (81 mg total) by mouth daily. Swallow whole. 30 tablet 1   atorvastatin (LIPITOR) 40 MG tablet Take 1 tablet (40 mg total) by mouth daily. 30 tablet 1   clopidogrel (PLAVIX) 75 MG tablet Take 1 tablet (75 mg total) by mouth daily. 21 tablet 0   Multiple Vitamin (MULTIVITAMIN) tablet Take 1 tablet by mouth daily.     telmisartan-hydrochlorothiazide (MICARDIS HCT) 40-12.5 MG tablet Take 1 tablet by mouth daily. 90 tablet 0   thiamine 100 MG tablet Take 1 tablet (100 mg total) by mouth daily. 30 tablet 1   vitamin  B-12 (CYANOCOBALAMIN) 1000 MCG tablet Take 1 tablet (1,000 mcg total) by mouth daily. 30 tablet 1   No current facility-administered medications for this visit.    REVIEW OF SYSTEMS:    10 Point review of Systems was done is negative except as noted above.  PHYSICAL EXAMINATION: ECOG PERFORMANCE STATUS:   . Vitals:   06/02/21 1301  BP: (!) 148/101  Pulse: 85  Resp: 20  Temp: 98.2 F (36.8 C)  SpO2: 99%   Filed Weights   06/02/21 1301  Weight: 208 lb 4.8 oz (94.5 kg)   .Body mass index is 29.05 kg/m.  GENERAL:alert, in no acute distress and comfortable SKIN: no acute rashes, no significant lesions EYES: conjunctiva are pink and non-injected, sclera anicteric OROPHARYNX: MMM, no exudates, no oropharyngeal  erythema or ulceration NECK: supple, no JVD LYMPH:  no palpable lymphadenopathy in the cervical, axillary or inguinal regions LUNGS: clear to auscultation b/l with normal respiratory effort HEART: regular rate & rhythm ABDOMEN:  normoactive bowel sounds , non tender, not distended.  No palpable hepatosplenomegaly Extremity: no pedal edema PSYCH: alert & oriented x 3 with fluent speech NEURO: no focal motor/sensory deficits  LABORATORY DATA:  I have reviewed the data as listed  . CBC Latest Ref Rng & Units 05/11/2021 05/02/2021 05/02/2021  WBC 4.0 - 10.5 K/uL 7.1 - 9.1  Hemoglobin 13.0 - 17.0 g/dL 17.4(H) 17.7(H) 18.0(H)  Hematocrit 39.0 - 52.0 % 51.7 52.0 51.0  Platelets 150.0 - 400.0 K/uL 241.0 - 240    . CMP Latest Ref Rng & Units 05/11/2021 05/02/2021 05/02/2021  Glucose 70 - 99 mg/dL 84 103(H) 104(H)  BUN 6 - 23 mg/dL 15 15 12   Creatinine 0.40 - 1.50 mg/dL 0.90 0.90 0.98  Sodium 135 - 145 mEq/L 139 138 140  Potassium 3.5 - 5.1 mEq/L 4.3 3.8 3.7  Chloride 96 - 112 mEq/L 103 101 103  CO2 19 - 32 mEq/L 28 - 26  Calcium 8.4 - 10.5 mg/dL 9.3 - 9.1  Total Protein 6.0 - 8.3 g/dL 7.3 - 7.3  Total Bilirubin 0.2 - 1.2 mg/dL 1.3(H) - 1.5(H)  Alkaline Phos 39 - 117 U/L 75 - 67  AST 0 - 37 U/L 25 - 25  ALT 0 - 53 U/L 49 - 34     RADIOGRAPHIC STUDIES: I have personally reviewed the radiological images as listed and agreed with the findings in the report.  ASSESSMENT & PLAN:   56 year old male with  1) Polycythemia  Rule out polycythemia vera though less likely in the absence of other leukocytosis/thrombocytosis and hepatosplenomegaly. PLAN -We discussed different causes for polycythemia. -We discussed that it is important to differentiate polycythemia vera from secondary causes of polycythemia since it has different natural history and different risk profile with regards to progression, VTE and vascular risk and risk of other associated bone marrow conditions. -We will send out genetic  testing to evaluate for clonal markers for erythropoiesis. -Patient has several secondary risk factors including dehydration in the context of using hydrochlorothiazide and excessive alcohol use with inadequate p.o. fluid intake.  This can certainly cause hemoconcentration. -Patient was encouraged to drink at least 2 L of water daily and minimize/completely abstain from alcohol use. -Patient is an ex-smoker but does not note an obvious history of COPD.  He quit smoking in 2018. -Patient has had a history of previous CVA and is on aspirin and Plavix already.  Follow-up Labs today Phone visit with Dr Irene Limbo in 2 weeks  Orders Placed  This Encounter  Procedures   CBC with Differential/Platelet    Standing Status:   Future    Number of Occurrences:   1    Standing Expiration Date:   06/02/2022   CMP ([place] only)    Standing Status:   Future    Number of Occurrences:   1    Standing Expiration Date:   06/02/2022   Erythropoietin    Standing Status:   Future    Number of Occurrences:   1    Standing Expiration Date:   06/02/2022   JAK2 (including V617F and Exon 12), MPL, and CALR-Next Generation Sequencing    Standing Status:   Future    Number of Occurrences:   1    Standing Expiration Date:   06/02/2022   Ferritin    Standing Status:   Future    Number of Occurrences:   1    Standing Expiration Date:   06/02/2022    All of the patients questions were answered with apparent satisfaction. The patient knows to call the clinic with any problems, questions or concerns.  I spent 35 minutes counseling the patient face to face. The total time spent in the appointment was 47 minutes and more than 50% was on counseling and direct patient cares.    Sullivan Lone MD [place] AAHIVMS [facility] Hematology/Oncology Physician [place]  06/02/2021 1:13 PM

## 2021-06-03 LAB — ERYTHROPOIETIN: Erythropoietin: 7.8 m[IU]/mL (ref 2.6–18.5)

## 2021-06-04 ENCOUNTER — Ambulatory Visit: Payer: 59 | Admitting: Physical Therapy

## 2021-06-09 ENCOUNTER — Ambulatory Visit: Payer: 59 | Admitting: Physical Therapy

## 2021-06-09 LAB — JAK2 (INCLUDING V617F AND EXON 12), MPL,& CALR-NEXT GEN SEQ

## 2021-06-11 ENCOUNTER — Ambulatory Visit: Payer: 59 | Admitting: Physical Therapy

## 2021-06-12 ENCOUNTER — Ambulatory Visit (INDEPENDENT_AMBULATORY_CARE_PROVIDER_SITE_OTHER): Payer: 59 | Admitting: Registered Nurse

## 2021-06-12 ENCOUNTER — Other Ambulatory Visit: Payer: Self-pay

## 2021-06-12 VITALS — BP 161/107 | HR 89 | Temp 98.0°F | Resp 18 | Ht 68.0 in | Wt 208.8 lb

## 2021-06-12 DIAGNOSIS — M25521 Pain in right elbow: Secondary | ICD-10-CM | POA: Diagnosis not present

## 2021-06-12 DIAGNOSIS — Z1211 Encounter for screening for malignant neoplasm of colon: Secondary | ICD-10-CM | POA: Diagnosis not present

## 2021-06-12 DIAGNOSIS — I10 Essential (primary) hypertension: Secondary | ICD-10-CM | POA: Diagnosis not present

## 2021-06-12 DIAGNOSIS — M25522 Pain in left elbow: Secondary | ICD-10-CM

## 2021-06-12 DIAGNOSIS — I639 Cerebral infarction, unspecified: Secondary | ICD-10-CM

## 2021-06-12 NOTE — Progress Notes (Signed)
Established Patient Office Visit  Subjective:  Patient ID: Cameron Herrera, male    DOB: 06/09/1965  Age: 56 y.o. MRN: [id number]  CC:  Chief Complaint  Patient presents with   New Patient (Initial Visit)    Patient states he is here to establish care.    HPI Cameron Herrera presents for visit to est care  Have seen this pt for hospital follow up for CVA Continues to do clinically well. Has quit smoking, cut far back on drinking - 2 drinks 4 nights a week.   Hypertension: Patient Currently taking: telmisartan-hctz 40-12.5mg  po qd Good effect. No AEs. Denies CV symptoms including: chest pain, shob, doe, headache, visual changes, fatigue, claudication, and dependent edema.   Previous readings and labs: BP Readings from Last 3 Encounters:  06/12/21 (!) 161/107  06/02/21 (!) 148/101  05/11/21 (!) 146/90   Lab Results  Component Value Date   CREATININE 0.80 06/02/2021   Elbow pain Bilateral, waxes and wanes Aching Pain at medial and lateral epicondyles Worse when working with hands through the day Worse when sleeping in uncomfortable position Has not taken treatment.   Due for colon ca screen No fam hx, no symptoms  Past Medical History:  Diagnosis Date   HTN (hypertension)     History reviewed. No pertinent surgical history.  Family History  Problem Relation Age of Onset   Hypertension Mother    Hyperlipidemia Mother    Heart failure Mother    COPD Father        smoker   Diabetes Father    Hypertension Father    Hypertension Brother     Social History   Socioeconomic History   Marital status: Divorced    Spouse name: Not on file   Number of children: Not on file   Years of education: Not on file   Highest education level: Not on file  Occupational History   Not on file  Tobacco Use   Smoking status: Former    Types: Cigarettes    Quit date: 2018    Years since quitting: 5.1   Smokeless tobacco: Current    Types: Chew  Substance and Sexual Activity    Alcohol use: Yes    Comment: 3-4 beers per day + Vodka   Drug use: Never   Sexual activity: Not on file  Other Topics Concern   Not on file  Social History Narrative   Not on file   Social Determinants of Health   Financial Resource Strain: Not on file  Food Insecurity: Not on file  Transportation Needs: Not on file  Physical Activity: Not on file  Stress: Not on file  Social Connections: Not on file  Intimate Partner Violence: Not on file    Outpatient Medications Prior to Visit  Medication Sig Dispense Refill   aspirin EC 81 MG EC tablet Take 1 tablet (81 mg total) by mouth daily. Swallow whole. 30 tablet 1   clopidogrel (PLAVIX) 75 MG tablet Take 1 tablet (75 mg total) by mouth daily. 21 tablet 0   Multiple Vitamin (MULTIVITAMIN) tablet Take 1 tablet by mouth daily.     thiamine 100 MG tablet Take 1 tablet (100 mg total) by mouth daily. 30 tablet 1   vitamin B-12 (CYANOCOBALAMIN) 1000 MCG tablet Take 1 tablet (1,000 mcg total) by mouth daily. 30 tablet 1   telmisartan-hydrochlorothiazide (MICARDIS HCT) 40-12.5 MG tablet Take 1 tablet by mouth daily. 90 tablet 0   atorvastatin (LIPITOR) 40 MG  tablet Take 1 tablet (40 mg total) by mouth daily. 30 tablet 1   No facility-administered medications prior to visit.    Not on File  ROS Review of Systems  Constitutional: Negative.   HENT: Negative.    Eyes: Negative.   Respiratory: Negative.    Cardiovascular: Negative.   Gastrointestinal: Negative.   Genitourinary: Negative.   Musculoskeletal: Negative.   Skin: Negative.   Neurological: Negative.   Psychiatric/Behavioral: Negative.       Objective:    Physical Exam Constitutional:      General: He is not in acute distress.    Appearance: Normal appearance. He is normal weight. He is not ill-appearing, toxic-appearing or diaphoretic.  Cardiovascular:     Rate and Rhythm: Normal rate and regular rhythm.     Heart sounds: Normal heart sounds. No murmur heard.   No  friction rub. No gallop.  Pulmonary:     Effort: Pulmonary effort is normal. No respiratory distress.     Breath sounds: Normal breath sounds. No stridor. No wheezing, rhonchi or rales.  Chest:     Chest wall: No tenderness.  Neurological:     General: No focal deficit present.     Mental Status: He is alert and oriented to person, place, and time. Mental status is at baseline.  Psychiatric:        Mood and Affect: Mood normal.        Behavior: Behavior normal.        Thought Content: Thought content normal.        Judgment: Judgment normal.    BP (!) 161/107    Pulse 89    Temp 98 F (36.7 C) (Temporal)    Resp 18    Ht 5\' 8"  (1.727 m)    Wt 208 lb 12.8 oz (94.7 kg)    SpO2 98%    BMI 31.75 kg/m  Wt Readings from Last 3 Encounters:  06/12/21 208 lb 12.8 oz (94.7 kg)  06/02/21 208 lb 4.8 oz (94.5 kg)  05/11/21 204 lb (92.5 kg)     Health Maintenance Due  Topic Date Due   Hepatitis C Screening  Never done   TETANUS/TDAP  Never done   COLONOSCOPY (Pts 45-72yrs Insurance coverage will need to be confirmed)  Never done   COVID-19 Vaccine (4 - Booster for Moderna series) 07/22/2020    There are no preventive care reminders to display for this patient.  Lab Results  Component Value Date   TSH 1.478 05/03/2021   Lab Results  Component Value Date   WBC 6.9 06/02/2021   HGB 16.6 06/02/2021   HCT 44.9 06/02/2021   MCV 87.9 06/02/2021   PLT 202 06/02/2021   Lab Results  Component Value Date   NA 136 06/02/2021   K 3.4 (L) 06/02/2021   CO2 25 06/02/2021   GLUCOSE 136 (H) 06/02/2021   BUN 13 06/02/2021   CREATININE 0.80 06/02/2021   BILITOT 1.5 (H) 06/02/2021   ALKPHOS 104 06/02/2021   AST 23 06/02/2021   ALT 48 (H) 06/02/2021   PROT 6.9 06/02/2021   ALBUMIN 4.4 06/02/2021   CALCIUM 9.0 06/02/2021   ANIONGAP 9 06/02/2021   GFR 94.85 05/11/2021   Lab Results  Component Value Date   CHOL 164 05/03/2021   Lab Results  Component Value Date   HDL 46 05/03/2021    Lab Results  Component Value Date   LDLCALC 100 (H) 05/03/2021   Lab Results  Component Value Date  TRIG 92 05/03/2021   Lab Results  Component Value Date   CHOLHDL 3.6 05/03/2021   Lab Results  Component Value Date   HGBA1C 5.2 05/03/2021      Assessment & Plan:   Problem List Items Addressed This Visit       Cardiovascular and Mediastinum   Essential hypertension - Primary   Relevant Medications   atorvastatin (LIPITOR) 40 MG tablet   telmisartan-hydrochlorothiazide (MICARDIS HCT) 80-25 MG tablet   Acute ischemic stroke (HCC)   Relevant Medications   atorvastatin (LIPITOR) 40 MG tablet   telmisartan-hydrochlorothiazide (MICARDIS HCT) 80-25 MG tablet   Other Visit Diagnoses     Pain of both elbows       Relevant Medications   tiZANidine (ZANAFLEX) 2 MG tablet   Diclofenac Sodium 1 % CREA   Colon cancer screening       Relevant Orders   Cologuard       Meds ordered this encounter  Medications   atorvastatin (LIPITOR) 40 MG tablet    Sig: Take 1 tablet (40 mg total) by mouth daily.    Dispense:  30 tablet    Refill:  1    Order Specific Question:   Supervising Provider    Answer:   Carlota Raspberry, JEFFREY R [[id number]]   telmisartan-hydrochlorothiazide (MICARDIS HCT) 80-25 MG tablet    Sig: Take 1 tablet by mouth daily.    Dispense:  90 tablet    Refill:  1    Order Specific Question:   Supervising Provider    Answer:   Carlota Raspberry, JEFFREY R [[id number]]   tiZANidine (ZANAFLEX) 2 MG tablet    Sig: Take 1 tablet (2 mg total) by mouth every 6 (six) hours as needed for muscle spasms.    Dispense:  60 tablet    Refill:  0    Order Specific Question:   Supervising Provider    Answer:   Carlota Raspberry, JEFFREY R [[id number]]   Diclofenac Sodium 1 % CREA    Sig: Apply 4 g topically 4 (four) times daily as needed.    Dispense:  120 g    Refill:  2    Order Specific Question:   Supervising Provider    Answer:   Carlota Raspberry, JEFFREY R [id number]    Follow-up: Return in about 3 months (around  09/09/2021) for htn.   PLAN Increase antihypertensives to telmisartan-hctz 80-12.5mg  po qd. Bp check in 2-3 weeks. Continue home checks Cologuard sent for colon ca screen Topical diclofenac for elbow pain. RICE method discussed. Can use tizanidine po qid prn Return in 3 mo for bp check Patient encouraged to call clinic with any questions, comments, or concerns.  Maximiano Coss, NP

## 2021-06-12 NOTE — Patient Instructions (Addendum)
Mr. Bisceglia -   Randie Heinz to see you  Call with any concerns  Let's get that BP down below 130/80.   See you in 3-6 mo  Thanks,   Rich     If you have lab work done today you will be contacted with your lab results within the next 2 weeks.  If you have not heard from [place] then please contact us. The fastest way to get your results is to register for My Chart.   IF you received an x-ray today, you will receive an invoice from [facility]. Please contact [facility] at [phone] with questions or concerns regarding your invoice.   IF you received labwork today, you will receive an invoice from [facility]. Please contact [facility] at [phone] with questions or concerns regarding your invoice.   Our billing staff will not be able to assist you with questions regarding bills from these companies.  You will be contacted with the lab results as soon as they are available. The fastest way to get your results is to activate your My Chart account. Instructions are located on the last page of this paperwork. If you have not heard from [place] regarding the results in 2 weeks, please contact this office.

## 2021-06-13 ENCOUNTER — Encounter: Payer: Self-pay | Admitting: Registered Nurse

## 2021-06-13 MED ORDER — TIZANIDINE HCL 2 MG PO TABS: 2.0000 mg | ORAL_TABLET | Freq: Four times a day (QID) | ORAL | 0 refills | Status: DC | PRN

## 2021-06-13 MED ORDER — TELMISARTAN-HCTZ 80-25 MG PO TABS: 1.0000 | ORAL_TABLET | Freq: Every day | ORAL | 1 refills | Status: DC

## 2021-06-13 MED ORDER — ATORVASTATIN CALCIUM 40 MG PO TABS: 40.0000 mg | ORAL_TABLET | Freq: Every day | ORAL | 1 refills | Status: DC

## 2021-06-13 MED ORDER — DICLOFENAC SODIUM 1 % EX CREA: 4.0000 g | TOPICAL_CREAM | Freq: Four times a day (QID) | CUTANEOUS | 2 refills | Status: DC | PRN

## 2021-06-15 ENCOUNTER — Other Ambulatory Visit: Payer: Self-pay

## 2021-06-15 ENCOUNTER — Inpatient Hospital Stay (HOSPITAL_BASED_OUTPATIENT_CLINIC_OR_DEPARTMENT_OTHER): Payer: 59 | Admitting: Hematology

## 2021-06-15 DIAGNOSIS — R7989 Other specified abnormal findings of blood chemistry: Secondary | ICD-10-CM

## 2021-06-15 DIAGNOSIS — D751 Secondary polycythemia: Secondary | ICD-10-CM

## 2021-06-15 NOTE — Progress Notes (Signed)
[name]   HEMATOLOGY/ONCOLOGY PHONE VISIT NOTE  Date of Service: 06/15/2021  Patient Care Team: Maximiano Coss, NP as PCP - General (Adult Health Nurse Practitioner)  CHIEF COMPLAINTS/PURPOSE OF CONSULTATION:  Phone visit to discuss work-up of polycythemia  HISTORY OF PRESENTING ILLNESS:  Cameron Herrera is a wonderful 56 y.o. male who has been referred to [place] by Dr .Maximiano Coss, NP  for evaluation and management of polycythemia  Patient last had labs with his primary care physician on 05/11/2021 which showed hemoglobin of 17.4 with a hematocrit of 51.7 with a WBC count of 7.1k and platelets of 241k.  Review of labs showed that he has had elevated hemoglobin and hematocrit levels since at least September 2021.  Patient notes no chest pain or shortness of breath.  No new leg pain or swelling. No change in vision. No headaches or new focal neurological deficits.  Off smokeless tobacco No smoked tobacco since 2018 No VTE No history of MI CVA 04/2021  ETOH -  2-10 drinks /day vodka/whisky-- cutting down.  HCTZ No sleep apnea.  No testosterone supplementation.  INTERVAL HISTORY  .I connected with Cameron Herrera on 06/15/21 at  9:20 AM EST by telephone visit and verified that I am speaking with the correct person using two identifiers.   I discussed the limitations, risks, security and privacy concerns of performing an evaluation and management service by telemedicine and the availability of in-person appointments. I also discussed with the patient that there may be a patient responsible charge related to this service. The patient expressed understanding and agreed to proceed.   Other persons participating in the visit and their role in the encounter: None  Patients location: Home Providers location: [place]  Chief Complaint: Discussion of lab work-up for polycythemia  Patient was called to discuss his lab work-up from his recent clinic visit for evaluation of  polycythemia. He notes no new symptoms. Has been trying to cut down alcohol and hydrating better. Lab results were discussed in details as noted below.  MEDICAL HISTORY:  Past Medical History:  Diagnosis Date   HTN (hypertension)   CVA 2023 - [facility]  ETOH excess B12 deficiency  SURGICAL HISTORY: -Broken nose -tonsillectomy 2007  SOCIAL HISTORY: Social History   Socioeconomic History   Marital status: Divorced    Spouse name: Not on file   Number of children: Not on file   Years of education: Not on file   Highest education level: Not on file  Occupational History   Not on file  Tobacco Use   Smoking status: Former    Types: Cigarettes    Quit date: 2018    Years since quitting: 5.1   Smokeless tobacco: Current    Types: Chew  Substance and Sexual Activity   Alcohol use: Yes    Comment: 3-4 beers per day + Vodka   Drug use: Never   Sexual activity: Not on file  Other Topics Concern   Not on file  Social History Narrative   Not on file   Social Determinants of Health   Financial Resource Strain: Not on file  Food Insecurity: Not on file  Transportation Needs: Not on file  Physical Activity: Not on file  Stress: Not on file  Social Connections: Not on file  Intimate Partner Violence: Not on file  Ex firefighter   FAMILY HISTORY: Family History  Problem Relation Age of Onset   Hypertension Mother    Hyperlipidemia Mother    Heart  failure Mother    COPD Father        smoker   Diabetes Father    Hypertension Father    Hypertension Brother    Mother - CAD- died young  ALLERGIES:  has no allergies on file.  MEDICATIONS:  Current Outpatient Medications  Medication Sig Dispense Refill   aspirin EC 81 MG EC tablet Take 1 tablet (81 mg total) by mouth daily. Swallow whole. 30 tablet 1   atorvastatin (LIPITOR) 40 MG tablet Take 1 tablet (40 mg total) by mouth daily. 30 tablet 1   clopidogrel (PLAVIX) 75 MG tablet Take 1 tablet (75 mg total) by mouth  daily. 21 tablet 0   Diclofenac Sodium 1 % CREA Apply 4 g topically 4 (four) times daily as needed. 120 g 2   Multiple Vitamin (MULTIVITAMIN) tablet Take 1 tablet by mouth daily.     telmisartan-hydrochlorothiazide (MICARDIS HCT) 80-25 MG tablet Take 1 tablet by mouth daily. 90 tablet 1   thiamine 100 MG tablet Take 1 tablet (100 mg total) by mouth daily. 30 tablet 1   tiZANidine (ZANAFLEX) 2 MG tablet Take 1 tablet (2 mg total) by mouth every 6 (six) hours as needed for muscle spasms. 60 tablet 0   vitamin B-12 (CYANOCOBALAMIN) 1000 MCG tablet Take 1 tablet (1,000 mcg total) by mouth daily. 30 tablet 1   No current facility-administered medications for this visit.    REVIEW OF SYSTEMS:    no acute new symptoms except noted above  PHYSICAL EXAMINATION: Telemedicine visit   LABORATORY DATA:  I have reviewed the data as listed  . CBC Latest Ref Rng & Units 06/02/2021 05/11/2021 05/02/2021  WBC 4.0 - 10.5 K/uL 6.9 7.1 -  Hemoglobin 13.0 - 17.0 g/dL 16.6 17.4(H) 17.7(H)  Hematocrit 39.0 - 52.0 % 44.9 51.7 52.0  Platelets 150 - 400 K/uL 202 241.0 -    . CMP Latest Ref Rng & Units 06/02/2021 05/11/2021 05/02/2021  Glucose 70 - 99 mg/dL 136(H) 84 103(H)  BUN 6 - 20 mg/dL 13 15 15   Creatinine 0.61 - 1.24 mg/dL 0.80 0.90 0.90  Sodium 135 - 145 mmol/L 136 139 138  Potassium 3.5 - 5.1 mmol/L 3.4(L) 4.3 3.8  Chloride 98 - 111 mmol/L 102 103 101  CO2 22 - 32 mmol/L 25 28 -  Calcium 8.9 - 10.3 mg/dL 9.0 9.3 -  Total Protein 6.5 - 8.1 g/dL 6.9 7.3 -  Total Bilirubin 0.3 - 1.2 mg/dL 1.5(H) 1.3(H) -  Alkaline Phos 38 - 126 U/L 104 75 -  AST 15 - 41 U/L 23 25 -  ALT 0 - 44 U/L 48(H) 49 -   . Lab Results  Component Value Date   IRON 158 06/02/2021   TIBC 287 06/02/2021   IRONPCTSAT 55 (H) 06/02/2021   (Iron and TIBC)  Lab Results  Component Value Date   FERRITIN 972 (H) 06/02/2021   Component     Latest Ref Rng & Units 06/02/2021  Erythropoietin     2.6 - 18.5 mIU/mL 7.8      RADIOGRAPHIC STUDIES: I have personally reviewed the radiological images as listed and agreed with the findings in the report.  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  Patient's JAK2 mutation testing is negative which makes it less than 1% likely to be polycythemia vera. PLAN -  Lab results from 06/02/2021 discussed in details. -His CBC shows resolution of polycythemia with decreased alcohol intake and increase hydration. -Discussed that his genetic testing suggests that this is not polycythemia vera. -Erythropoietin levels within normal limits also argues against clonal erythrocytosis. -Encouraged him to pursue complete alcohol cessation and drink at least 2 L of water daily. -Continue to stay off cigarettes and other forms of tobacco. -No indication for therapeutic phlebotomy from a polycythemia at this time. -If recurrent hemoconcentration primary care physician might consider having him off diuretics.  2) Elevated ferritin levels . Lab Results  Component Value Date   IRON 158 06/02/2021   TIBC 287 06/02/2021   IRONPCTSAT 55 (H) 06/02/2021   (Iron and TIBC)  Lab Results  Component Value Date   FERRITIN 972 (H) 06/02/2021   Plan -Additional labs to rule out hereditary hemochromatosis -Absolute alcohol cessation -We discussed that his elevated ferritin levels could be secondary to transaminitis, secondary hemosiderosis from chronic alcohol-related liver disease but will rule out hereditary hemochromatosis.  Follow-up Labs in 3-4 days Phone visit with Dr Irene Limbo in 2 weeks  No orders of the defined types were placed in this encounter.  Total time spent on phone visit 21 minutes   Sullivan Lone MD [place] AAHIVMS [facility] Hematology/Oncology Physician [place]  06/15/2021 9:39 AM

## 2021-06-16 ENCOUNTER — Ambulatory Visit: Payer: 59 | Admitting: Physical Therapy

## 2021-06-17 ENCOUNTER — Telehealth: Payer: Self-pay | Admitting: Hematology

## 2021-06-17 NOTE — Telephone Encounter (Signed)
Left message with follow-up appointments per 2/20 los. °

## 2021-06-18 ENCOUNTER — Ambulatory Visit: Payer: 59 | Admitting: Physical Therapy

## 2021-06-19 ENCOUNTER — Inpatient Hospital Stay: Payer: 59

## 2021-06-22 ENCOUNTER — Ambulatory Visit: Payer: 59 | Admitting: Adult Health

## 2021-06-22 ENCOUNTER — Encounter: Payer: Self-pay | Admitting: Adult Health

## 2021-06-22 VITALS — BP 139/94 | HR 107 | Ht 71.0 in | Wt 207.0 lb

## 2021-06-22 DIAGNOSIS — E785 Hyperlipidemia, unspecified: Secondary | ICD-10-CM

## 2021-06-22 DIAGNOSIS — I639 Cerebral infarction, unspecified: Secondary | ICD-10-CM

## 2021-06-22 DIAGNOSIS — Z09 Encounter for follow-up examination after completed treatment for conditions other than malignant neoplasm: Secondary | ICD-10-CM | POA: Diagnosis not present

## 2021-06-22 DIAGNOSIS — I10 Essential (primary) hypertension: Secondary | ICD-10-CM | POA: Diagnosis not present

## 2021-06-22 NOTE — Patient Instructions (Addendum)
Continue aspirin 81 mg daily  and restart atorvastatin for secondary stroke prevention - your PCP sent in a refill for your atorvastatin last week to [facility]  Repeat cholesterol levels today  Continue to follow up with PCP regarding cholesterol and blood pressure management  Maintain strict control of hypertension with blood pressure goal below 130/90 and cholesterol with LDL cholesterol (bad cholesterol) goal below 70 mg/dL.   Signs of a Stroke? Follow the BEFAST method:  Balance Watch for a sudden loss of balance, trouble with coordination or vertigo Eyes Is there a sudden loss of vision in one or both eyes? Or double vision?  Face: Ask the person to smile. Does one side of the face droop or is it numb?  Arms: Ask the person to raise both arms. Does one arm drift downward? Is there weakness or numbness of a leg? Speech: Ask the person to repeat a simple phrase. Does the speech sound slurred/strange? Is the person confused ? Time: If you observe any of these signs, call 911.     As you are doing well from a stroke standpoint, we can plan on having you follow up as needed. You will continue to follow with your PCP for continued aggressive stroke risk factor management but please call office with any questions or concerns regarding your stroke    Thank you for coming to see [place] at [facility]. I hope we have been able to provide you high quality care today.  You may receive a patient satisfaction survey over the next few weeks. We would appreciate your feedback and comments so that we may continue to improve ourselves and the health of our patients.

## 2021-06-22 NOTE — Progress Notes (Signed)
[facility] [address] [place]. [place] [address] [phone]       HOSPITAL FOLLOW UP NOTE  [name] Date of Birth:  Mar 21, 1966 Medical Record Number:  [id number]   Reason for Referral:  hospital stroke follow up    SUBJECTIVE:   CHIEF COMPLAINT:  Chief Complaint  Patient presents with   Follow-up    RM 3 alone Pt is well, having some numbness/tingling on L side but overall stable     HPI:   Cameron Herrera is a 56 year old male with history of hypertension, heavy alcohol use, former smoker who presented on 05/02/2021 with left-sided tingling, from left back of head, left neck, arm, trunk and leg.  Personally reviewed hospitalization pertinent progress notes, lab work and imaging.  Evaluated by Dr. Erlinda Hong.  CT no acute abnormality.  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  MRA head and neck unremarkable.  2D 65 to 70%. LDL 100, A1c 5.2.  Creatinine 0.9.  Hemoglobin 17.6-18.0-17.7.  Advised f/u with hematology outpatient if polycythemia persists.  B12 170 - placed on supplementation. BP on the higher end 200s->160s during admission. Per Dr. Erlinda Hong exam, neurologically intact without focal deficit although subjective left arm and leg tingling.  Etiology likely small vessel disease given location and risk factors.  Recommended aspirin Plavix for 3 weeks and aspirin alone and start atorvastatin 40 mg daily.  Education regarding EtOH and smokeless tobacco use.  Discharged home with outpatient PT/OT as recommended by therapies.    Today, 06/22/2021, patient being seen for initial hospital follow-up unaccompanied.  Overall stable without new stroke/TIA symptoms.  Does report continued left-sided numbness/tingling - more persistent at neck, hand and foot. Reports occasional dysesthesias but overall tolerable.  Symptoms can fluctuate and can typically be worse in the evening.  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  Completed 3 weeks DAPT and remains on aspirin alone without side effects. Reports running out of atorvastatin 1 week ago but tolerating without side effects. Does report full body stiffness sensation - unsure if this was present prior to stroke - did not improve after stopping atorvastatin. blood pressure today 139/94.  Routinely monitors at home and has been gradually improving since BP med adjustment last week with PCP. Does mention chronic right elbow pain, worsens if resting on elbow or any pressure to elbow area. Will have numbness/tingling/dysesthesias from elbow to right hand 4th and 5th digits. Will wake up with symptoms at times. Did see PCP last week for this concern - placed on tizanidine and diclofenac gel but not yet started. Has since had f/u with hematology with repeat lab work showing improvement of polycythemia although high ferritin levels, in process of further evaluation.  Per hemat OV note review, felt polycythemia likely secondary to hemoconcentration from heavy EtOH use, decreased p.o. intake and diuretics. Reports limited EtOH use "a couple drinks per week" and denies any additional tobacco use.  No further concerns at this time.        PERTINENT IMAGING  Per hospitalization 05/02/2021 Code Stroke CT  No acute abnormality. MRI  acute/subacute ischemia in the dorsal right pons MRA  normal MRA of head and neck 2D Echo EF 65 to 70% LDL 100 HgbA1c 5.2       ROS:   14 system review of systems performed and negative with exception of those listed in HPI  PMH:  Past Medical History:  Diagnosis Date   HTN (hypertension)     PSH: History reviewed. No pertinent surgical history.  Social History:  Social History   Socioeconomic History   Marital status: Divorced    Spouse name: Not on file   Number of children: Not on file   Years of education: Not on file   Highest education level: Not on file  Occupational History   Not on file  Tobacco Use    Smoking status: Former    Types: Cigarettes    Quit date: 2018    Years since quitting: 5.1   Smokeless tobacco: Current    Types: Chew  Substance and Sexual Activity   Alcohol use: Yes    Comment: 3-4 beers per day + Vodka   Drug use: Never   Sexual activity: Not on file  Other Topics Concern   Not on file  Social History Narrative   Not on file   Social Determinants of Health   Financial Resource Strain: Not on file  Food Insecurity: Not on file  Transportation Needs: Not on file  Physical Activity: Not on file  Stress: Not on file  Social Connections: Not on file  Intimate Partner Violence: Not on file    Family History:  Family History  Problem Relation Age of Onset   Hypertension Mother    Hyperlipidemia Mother    Heart failure Mother    COPD Father        smoker   Diabetes Father    Hypertension Father    Hypertension Brother     Medications:   Current Outpatient Medications on File Prior to Visit  Medication Sig Dispense Refill   aspirin EC 81 MG EC tablet Take 1 tablet (81 mg total) by mouth daily. Swallow whole. 30 tablet 1   atorvastatin (LIPITOR) 40 MG tablet Take 1 tablet (40 mg total) by mouth daily. 30 tablet 1   Diclofenac Sodium 1 % CREA Apply 4 g topically 4 (four) times daily as needed. 120 g 2   Multiple Vitamin (MULTIVITAMIN) tablet Take 1 tablet by mouth daily.     telmisartan-hydrochlorothiazide (MICARDIS HCT) 80-25 MG tablet Take 1 tablet by mouth daily. 90 tablet 1   thiamine 100 MG tablet Take 1 tablet (100 mg total) by mouth daily. 30 tablet 1   tiZANidine (ZANAFLEX) 2 MG tablet Take 1 tablet (2 mg total) by mouth every 6 (six) hours as needed for muscle spasms. 60 tablet 0   vitamin B-12 (CYANOCOBALAMIN) 1000 MCG tablet Take 1 tablet (1,000 mcg total) by mouth daily. 30 tablet 1   No current facility-administered medications on file prior to visit.    Allergies:  Not on File    OBJECTIVE:  Physical Exam  Vitals:   06/22/21  1405  BP: (!) 139/94  Pulse: (!) 107  Weight: 207 lb (93.9 kg)  Height: 5\' 11"  (1.803 m)   Body mass index is 28.87 kg/m. No results found.  Post stroke PHQ 2/9 Depression screen PHQ 2/9 06/12/2021  Decreased Interest 0  Down, Depressed, Hopeless 0  PHQ - 2 Score 0  Altered sleeping 0  Tired, decreased energy 0  Change in appetite 0  Feeling bad or failure about yourself  -  Trouble concentrating 0  Moving slowly or fidgety/restless 0  Suicidal thoughts 0  PHQ-9 Score 0  Difficult doing work/chores Not difficult at all     General: well developed, well nourished, very pleasant middle-age Caucasian male, seated, in no evident distress Head:  head normocephalic and atraumatic.   Neck: supple with no carotid or supraclavicular bruits Cardiovascular: regular rate and rhythm, no murmurs Musculoskeletal: no deformity Skin:  no rash/petichiae Vascular:  Normal pulses all extremities   Neurologic Exam Mental Status: Awake and fully alert.  Fluent speech and language.  Oriented to place and time. Recent and remote memory intact. Attention span, concentration and fund of knowledge appropriate. Mood and affect appropriate.  Cranial Nerves: Fundoscopic exam reveals sharp disc margins. Pupils equal, briskly reactive to light. Extraocular movements full without nystagmus. Visual fields full to confrontation. Hearing intact. Facial sensation intact. Face, tongue, palate moves normally and symmetrically.  Motor: Normal bulk and tone. Normal strength in all tested extremity muscles Sensory.: intact to touch , pinprick , position and vibratory sensation.  Coordination: Rapid alternating movements normal in all extremities. Finger-to-nose and heel-to-shin performed accurately bilaterally. Gait and Station: Arises from chair without difficulty. Stance is normal. Gait demonstrates normal stride length and balance without use of AD. Tandem walk and heel toe without difficulty.  Reflexes: 1+ and  symmetric. Toes downgoing.     NIHSS  0 Modified Rankin  1      ASSESSMENT: Cameron Herrera is a 56 y.o. 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 Vascular risk factors include HTN, HLD, and heavy EtOH use.      PLAN:  Right pontine stroke:  Residual deficit: left sided numbness/tingling.  Discussed typical recovery time and hopeful ongoing recovery.   continue aspirin 81 mg daily  and atorvastatin 40 mg daily for secondary stroke prevention.   Discussed secondary stroke prevention measures and importance of close PCP follow up for aggressive stroke risk factor management. I have gone over the pathophysiology of stroke, warning signs and symptoms, risk factors and their management in some detail with instructions to go to the closest emergency room for symptoms of concern. HTN: BP goal <130/90.  Stable on current regimen per PCP HLD: LDL goal <70. Recent LDL 100.  Restart atorvastatin 40 mg daily - advised recently filled by PCP - will repeat lipid panel today. Request ongoing monitoring/management by PCP EtOH and tobacco use: discussed importance of continued complete tobacco cessation and limiting EtOH use to 1-2 drinks per day.  Right elbow pain: possibly cubital tunnel syndrome from overuse.  Discussed conservative measures but if symptoms persist, recommend f/u with PCP for possible need of ortho evaluation     Doing well from stroke standpoint and risk factors are managed by PCP. She may follow up PRN, as usual for our patients who are strictly being followed for stroke. If any new neurological issues should arise, request PCP place referral for evaluation by one of our neurologists. Thank you.      CC:  PCP: Janeece Agee, NP    I spent 57 minutes of face-to-face and non-face-to-face time with patient.  This included previsit chart review including review of recent hospitalization, lab review, study review, order entry,  electronic health record documentation, patient education regarding recent stroke including etiology, secondary stroke prevention measures and importance of managing stroke risk factors, residual deficits and typical recovery time and other concerns as mentioned in HPI and A/P and answered all other questions to patient satisfaction   Ihor Austin, AGNP-BC  [facility] [address] [place], [place] [address]  Phone [phone] Fax [phone] Note: This document was prepared with digital dictation and possible smart phrase technology. Any transcriptional errors that result from  this process are unintentional.

## 2021-06-23 LAB — LIPID PANEL
Chol/HDL Ratio: 4.6 ratio (ref 0.0–5.0)
Cholesterol, Total: 179 mg/dL (ref 100–199)
HDL: 39 mg/dL — ABNORMAL LOW (ref >39)
LDL Chol Calc (NIH): 87 mg/dL (ref 0–99)
Triglycerides: 324 mg/dL — ABNORMAL HIGH (ref 0–149)
VLDL Cholesterol Cal: 53 mg/dL — ABNORMAL HIGH (ref 5–40)

## 2021-07-02 ENCOUNTER — Telehealth: Payer: 59 | Admitting: Hematology

## 2021-07-08 ENCOUNTER — Other Ambulatory Visit: Payer: Self-pay | Admitting: *Deleted

## 2021-07-08 DIAGNOSIS — D751 Secondary polycythemia: Secondary | ICD-10-CM

## 2021-07-09 ENCOUNTER — Inpatient Hospital Stay: Payer: 59 | Attending: Hematology

## 2021-07-09 ENCOUNTER — Other Ambulatory Visit: Payer: Self-pay

## 2021-07-09 DIAGNOSIS — D751 Secondary polycythemia: Secondary | ICD-10-CM | POA: Diagnosis not present

## 2021-07-09 LAB — CMP (CANCER CENTER ONLY)
ALT: 32 U/L (ref 0–44)
AST: 20 U/L (ref 15–41)
Albumin: 4.2 g/dL (ref 3.5–5.0)
Alkaline Phosphatase: 78 U/L (ref 38–126)
Anion gap: 6 (ref 5–15)
BUN: 15 mg/dL (ref 6–20)
CO2: 30 mmol/L (ref 22–32)
Calcium: 9 mg/dL (ref 8.9–10.3)
Chloride: 104 mmol/L (ref 98–111)
Creatinine: 1 mg/dL (ref 0.61–1.24)
GFR, Estimated: >60 mL/min (ref >60)
Glucose, Bld: 138 mg/dL — ABNORMAL HIGH (ref 70–99)
Potassium: 3.9 mmol/L (ref 3.5–5.1)
Sodium: 140 mmol/L (ref 135–145)
Total Bilirubin: 1.4 mg/dL — ABNORMAL HIGH (ref 0.3–1.2)
Total Protein: 6.8 g/dL (ref 6.5–8.1)

## 2021-07-09 LAB — FERRITIN: Ferritin: 837 ng/mL — ABNORMAL HIGH (ref 24–336)

## 2021-07-09 LAB — IRON AND IRON BINDING CAPACITY (CC-WL,HP ONLY)
Iron: 163 ug/dL (ref 45–182)
Saturation Ratios: 55 % — ABNORMAL HIGH (ref 17.9–39.5)
TIBC: 298 ug/dL (ref 250–450)
UIBC: 135 ug/dL (ref 117–376)

## 2021-07-09 LAB — CBC WITH DIFFERENTIAL (CANCER CENTER ONLY)
Abs Immature Granulocytes: 0.05 10*3/uL (ref 0.00–0.07)
Basophils Absolute: 0 10*3/uL (ref 0.0–0.1)
Basophils Relative: 1 %
Eosinophils Absolute: 0.1 10*3/uL (ref 0.0–0.5)
Eosinophils Relative: 1 %
HCT: 42.9 % (ref 39.0–52.0)
Hemoglobin: 15.2 g/dL (ref 13.0–17.0)
Immature Granulocytes: 1 %
Lymphocytes Relative: 26 %
Lymphs Abs: 1.6 10*3/uL (ref 0.7–4.0)
MCH: 31.7 pg (ref 26.0–34.0)
MCHC: 35.4 g/dL (ref 30.0–36.0)
MCV: 89.6 fL (ref 80.0–100.0)
Monocytes Absolute: 0.5 10*3/uL (ref 0.1–1.0)
Monocytes Relative: 9 %
Neutro Abs: 3.9 10*3/uL (ref 1.7–7.7)
Neutrophils Relative %: 62 %
Platelet Count: 238 10*3/uL (ref 150–400)
RBC: 4.79 MIL/uL (ref 4.22–5.81)
RDW: 12 % (ref 11.5–15.5)
WBC Count: 6.3 10*3/uL (ref 4.0–10.5)
nRBC: 0 % (ref 0.0–0.2)

## 2021-07-17 LAB — HEMOCHROMATOSIS DNA-PCR(C282Y,H63D)

## 2021-07-22 ENCOUNTER — Other Ambulatory Visit: Payer: Self-pay | Admitting: Registered Nurse

## 2021-07-22 DIAGNOSIS — I10 Essential (primary) hypertension: Secondary | ICD-10-CM

## 2021-07-22 DIAGNOSIS — I639 Cerebral infarction, unspecified: Secondary | ICD-10-CM

## 2021-07-22 DIAGNOSIS — R195 Other fecal abnormalities: Secondary | ICD-10-CM

## 2021-07-22 LAB — COLOGUARD: COLOGUARD: POSITIVE — AB

## 2021-07-22 MED ORDER — ATORVASTATIN CALCIUM 40 MG PO TABS: 40.0000 mg | ORAL_TABLET | Freq: Every day | ORAL | 1 refills | Status: DC

## 2021-07-23 ENCOUNTER — Inpatient Hospital Stay (HOSPITAL_BASED_OUTPATIENT_CLINIC_OR_DEPARTMENT_OTHER): Payer: 59 | Admitting: Hematology

## 2021-07-23 DIAGNOSIS — D751 Secondary polycythemia: Secondary | ICD-10-CM | POA: Diagnosis not present

## 2021-07-23 DIAGNOSIS — R7989 Other specified abnormal findings of blood chemistry: Secondary | ICD-10-CM | POA: Diagnosis not present

## 2021-07-23 NOTE — Progress Notes (Signed)
. ? ? ?HEMATOLOGY/ONCOLOGY PHONE VISIT NOTE ? ?Date of Service: 07/23/2021 ? ?Patient Care Team: ?Cameron Agee, NP as PCP - General (Adult Health Nurse Practitioner) ? ?CHIEF COMPLAINTS/PURPOSE OF CONSULTATION:  ?Phone visit to discuss lab work-up for Hemochromatosis. ? ?HISTORY OF PRESENTING ILLNESS:  ?Cameron Herrera is a wonderful 56 y.o. male who has been referred to [place] by Dr .Cameron Agee, NP  for evaluation and management of polycythemia ? ?Patient last had labs with his primary care physician on 05/11/2021 which showed hemoglobin of 17.4 with a hematocrit of 51.7 with a WBC count of 7.1k and platelets of 241k. ? ?Review of labs showed that he has had elevated hemoglobin and hematocrit levels since at least September 2021. ? ?Patient notes no chest pain or shortness of breath.  No new leg pain or swelling. ?No change in vision. ?No headaches or new focal neurological deficits. ? ?Off smokeless tobacco ?No smoked tobacco since 2018 ?No VTE ?No history of MI ?CVA 04/2021 ? ?ETOH -  2-10 drinks /day vodka/whisky-- cutting down.  ?HCTZ ?No sleep apnea. ? ?No testosterone supplementation. ? ?INTERVAL HISTORY ?I connected with Cameron Herrera on 07/23/2021 at 9:20AM EST by telephone visit and verified that I am speaking with the correct person using two identifiers.  ? ?I discussed the limitations, risks, security and privacy concerns of performing an evaluation and management service by telemedicine and the availability of in-person appointments. I also discussed with the patient that there may be a patient responsible charge related to this service. The patient expressed understanding and agreed to proceed.  ? ?Other persons participating in the visit and their role in the encounter: None ? ?Patient?s location: Home ?Provider?s location: [facility] ? ?Chief Complaint: Discussion of lab work-up for Hemochromatosis  ? ?He reports he is doing well with no new symptoms or concerns. ? ?Patient was called to  discuss his lab work-up from his recent clinic visit for evaluation of polycythemia. ? ?Has been trying to cut down alcohol and hydrating better. We discussed that improved blood counts are probably due to cutting down on alcohol and hydrating better. We discussed elevated ferritin could be due to liver leaking iron and modifying diet to avoid excess iron intake, avoiding supplements with iron, and staying hydrated and avoiding alcohol. He notes he would like to take a conservative approach and that he would like to wait another 6 months with labs. We further discussed donating blood 2 or 3 months or doing a phlebotomy to reduce ferritin. ? ?Lab results were discussed in details as noted below. ?Ferritin has improved from 972 to 873.  ?CBC within normal limits. ?CMP stable. Bld glucose slightly elevated at 138. ? ?MEDICAL HISTORY:  ?Past Medical History:  ?Diagnosis Date  ? HTN (hypertension)   ?CVA 2023 - pontine  ?ETOH excess ?B12 deficiency ? ?SURGICAL HISTORY: ?-Broken nose ?-tonsillectomy 2007 ? ?SOCIAL HISTORY: ?Social History  ? ?Socioeconomic History  ? Marital status: Divorced  ?  Spouse name: Not on file  ? Number of children: Not on file  ? Years of education: Not on file  ? Highest education level: Not on file  ?Occupational History  ? Not on file  ?Tobacco Use  ? Smoking status: Former  ?  Types: Cigarettes  ?  Quit date: 2018  ?  Years since quitting: 5.2  ? Smokeless tobacco: Current  ?  Types: Chew  ?Substance and Sexual Activity  ? Alcohol use: Yes  ?  Comment: 3-4  beers per day + Vodka  ? Drug use: Never  ? Sexual activity: Not on file  ?Other Topics Concern  ? Not on file  ?Social History Narrative  ? Not on file  ? ?Social Determinants of Health  ? ?Financial Resource Strain: Not on file  ?Food Insecurity: Not on file  ?Transportation Needs: Not on file  ?Physical Activity: Not on file  ?Stress: Not on file  ?Social Connections: Not on file  ?Intimate Partner Violence: Not on file  ?Ex  firefighter  ? ?FAMILY HISTORY: ?Family History  ?Problem Relation Age of Onset  ? Hypertension Mother   ? Hyperlipidemia Mother   ? Heart failure Mother   ? COPD Father   ?     smoker  ? Diabetes Father   ? Hypertension Father   ? Hypertension Brother   ? ?Mother - CAD- died young ? ?ALLERGIES:  has no allergies on file. ? ?MEDICATIONS:  ?Current Outpatient Medications  ?Medication Sig Dispense Refill  ? aspirin EC 81 MG EC tablet Take 1 tablet (81 mg total) by mouth daily. Swallow whole. 30 tablet 1  ? atorvastatin (LIPITOR) 40 MG tablet Take 1 tablet (40 mg total) by mouth daily. 90 tablet 1  ? Multiple Vitamin (MULTIVITAMIN) tablet Take 1 tablet by mouth daily.    ? telmisartan-hydrochlorothiazide (MICARDIS HCT) 80-25 MG tablet Take 1 tablet by mouth daily. 90 tablet 1  ? thiamine 100 MG tablet Take 1 tablet (100 mg total) by mouth daily. 30 tablet 1  ? tiZANidine (ZANAFLEX) 2 MG tablet Take 1 tablet (2 mg total) by mouth every 6 (six) hours as needed for muscle spasms. 60 tablet 0  ? vitamin B-12 (CYANOCOBALAMIN) 1000 MCG tablet Take 1 tablet (1,000 mcg total) by mouth daily. 30 tablet 1  ? ?No current facility-administered medications for this visit.  ? ? ?REVIEW OF SYSTEMS:   ? ?no acute new symptoms except noted above ? ?PHYSICAL EXAMINATION: ?Telemedicine visit  ? ?LABORATORY DATA:  ?I have reviewed the data as listed ? ?. ? ?  Latest Ref Rng & Units 07/09/2021  ?  8:09 AM 06/02/2021  ?  1:55 PM 05/11/2021  ?  1:50 PM  ?CBC  ?WBC 4.0 - 10.5 K/uL 6.3   6.9   7.1    ?Hemoglobin 13.0 - 17.0 g/dL 10.2   58.5   27.7    ?Hematocrit 39.0 - 52.0 % 42.9   44.9   51.7    ?Platelets 150 - 400 K/uL 238   202   241.0    ? ? ?. ? ?  Latest Ref Rng & Units 07/09/2021  ?  8:09 AM 06/02/2021  ?  1:55 PM 05/11/2021  ?  1:50 PM  ?CMP  ?Glucose 70 - 99 mg/dL 824   235   84    ?BUN 6 - 20 mg/dL 15   13   15     ?Creatinine 0.61 - 1.24 mg/dL   3.61   4.43    ?Sodium 135 - 145 mmol/L 140   136   139    ?Potassium 3.5 - 5.1 mmol/L 3.9    3.4   4.3    ?Chloride 98 - 111 mmol/L 104   102   103    ?CO2 22 - 32 mmol/L 30   25   28     ?Calcium 8.9 - 10.3 mg/dL 9.0   9.0   9.3    ?Total Protein 6.5 - 8.1 g/dL 6.8  6.9   7.3    ?Total Bilirubin 0.3 - 1.2 mg/dL 1.4   1.5   1.3    ?Alkaline Phos 38 - 126 U/L 78   104   75    ?AST 15 - 41 U/L 20   23   25     ?ALT 0 - 44 U/L 32   48   49    ? ?. ?Lab Results  ?Component Value Date  ? IRON 163 07/09/2021  ? TIBC 298 07/09/2021  ? IRONPCTSAT 55 (H) 07/09/2021  ? ?(Iron and TIBC) ? ?Lab Results  ?Component Value Date  ? FERRITIN 837 (H) 07/09/2021  ? ?Component ?    Latest Ref Rng & Units 06/02/2021  ?Erythropoietin ?    2.6 - 18.5 mIU/mL 7.8  ? ? ? 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 ? ?RADIOGRAPHIC STUDIES: ?I have personally reviewed the radiological images as listed and agreed with the findings in the report. ? ?ASSESSMENT & PLAN:  ? ?56 year old male with ? 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 fluid intake and diuretics ?Unlikely polycythemia vera with negative clonal erythrocytosis mutation testing.  Patient's JAK2 mutation testing is negative which makes it less than 1% likely to be polycythemia vera. ? ?2) Elevated ferritin levels ?[name] Kitchen. ?Lab Results  ?Component Value Date  ? IRON 163 07/09/2021  ? TIBC 298 07/09/2021  ? IRONPCTSAT 55 (H) 07/09/2021  ? ?(Iron and TIBC) ? ?Lab Results  ?Component Value Date  ? FERRITIN 837 (H) 07/09/2021  ? ?PLAN ?-Lab results from 07/09/2021 discussed in details. ?-His CBC shows resolution of polycythemia with decreased alcohol intake and increase hydration. ?-Encouraged him to continue to pursue complete alcohol cessation and drink at least 2 L of water daily as there improvements in blood counts. ?-Continue to stay off cigarettes and other forms of tobacco. ?-No indication for therapeutic phlebotomy from a polycythemia at this  time. ?-If recurrent hemoconcentration primary care physician might consider having him off diuretics. ?-Lab done 07/09/2021 discussed in detail with pt. ?Ferritin has improved from 972 to 837.  ?CBC within normal l

## 2021-07-27 ENCOUNTER — Telehealth: Payer: Self-pay | Admitting: Hematology

## 2021-07-27 NOTE — Telephone Encounter (Signed)
Left message with follow-up appointment per 3/30 los. ?

## 2021-08-05 ENCOUNTER — Encounter: Payer: Self-pay | Admitting: Gastroenterology

## 2021-08-12 ENCOUNTER — Other Ambulatory Visit: Payer: Self-pay

## 2021-08-12 DIAGNOSIS — I639 Cerebral infarction, unspecified: Secondary | ICD-10-CM

## 2021-08-12 DIAGNOSIS — I10 Essential (primary) hypertension: Secondary | ICD-10-CM

## 2021-08-12 MED ORDER — TELMISARTAN-HCTZ 80-25 MG PO TABS: 1.0000 | ORAL_TABLET | Freq: Every day | ORAL | 0 refills | Status: DC

## 2021-09-04 ENCOUNTER — Ambulatory Visit: Payer: 59

## 2021-09-09 ENCOUNTER — Ambulatory Visit (AMBULATORY_SURGERY_CENTER): Payer: 59 | Admitting: *Deleted

## 2021-09-09 VITALS — Ht 71.0 in | Wt 210.0 lb

## 2021-09-09 DIAGNOSIS — Z1211 Encounter for screening for malignant neoplasm of colon: Secondary | ICD-10-CM

## 2021-09-09 MED ORDER — NA SULFATE-K SULFATE-MG SULF 17.5-3.13-1.6 GM/177ML PO SOLN: 1.0000 | Freq: Once | ORAL | 0 refills | Status: AC

## 2021-09-09 NOTE — Progress Notes (Signed)

## 2021-09-10 ENCOUNTER — Other Ambulatory Visit: Payer: Self-pay

## 2021-09-10 DIAGNOSIS — I639 Cerebral infarction, unspecified: Secondary | ICD-10-CM

## 2021-09-10 DIAGNOSIS — I10 Essential (primary) hypertension: Secondary | ICD-10-CM

## 2021-09-10 MED ORDER — TELMISARTAN-HCTZ 80-25 MG PO TABS: 1.0000 | ORAL_TABLET | Freq: Every day | ORAL | 0 refills | Status: DC

## 2021-09-10 MED ORDER — ATORVASTATIN CALCIUM 40 MG PO TABS: 40.0000 mg | ORAL_TABLET | Freq: Every day | ORAL | 0 refills | Status: DC

## 2021-09-14 ENCOUNTER — Ambulatory Visit: Payer: 59 | Admitting: Registered Nurse

## 2021-09-15 ENCOUNTER — Ambulatory Visit (INDEPENDENT_AMBULATORY_CARE_PROVIDER_SITE_OTHER): Payer: 59 | Admitting: Registered Nurse

## 2021-09-15 ENCOUNTER — Other Ambulatory Visit: Payer: Self-pay

## 2021-09-15 ENCOUNTER — Encounter: Payer: Self-pay | Admitting: Registered Nurse

## 2021-09-15 ENCOUNTER — Telehealth: Payer: Self-pay

## 2021-09-15 VITALS — BP 146/90 | HR 92 | Temp 98.3°F | Resp 18 | Ht 71.0 in | Wt 213.2 lb

## 2021-09-15 DIAGNOSIS — Z23 Encounter for immunization: Secondary | ICD-10-CM

## 2021-09-15 DIAGNOSIS — Z1159 Encounter for screening for other viral diseases: Secondary | ICD-10-CM

## 2021-09-15 DIAGNOSIS — I639 Cerebral infarction, unspecified: Secondary | ICD-10-CM

## 2021-09-15 DIAGNOSIS — Z8673 Personal history of transient ischemic attack (TIA), and cerebral infarction without residual deficits: Secondary | ICD-10-CM | POA: Diagnosis not present

## 2021-09-15 DIAGNOSIS — I10 Essential (primary) hypertension: Secondary | ICD-10-CM

## 2021-09-15 MED ORDER — NIFEDIPINE ER OSMOTIC RELEASE 60 MG PO TB24: 60.0000 mg | ORAL_TABLET | Freq: Every day | ORAL | 0 refills | Status: DC

## 2021-09-15 NOTE — Progress Notes (Signed)
Established Patient Office Visit  Subjective:  Patient ID: Cameron Herrera, male    DOB: 1965-06-30  Age: 56 y.o. MRN: [id number]  CC:  Chief Complaint  Patient presents with   Follow-up    Patient states he is here for a follow up on HTN.    HPI Cameron Herrera presents for htn  Hypertension: Patient Currently taking: telmisartan-hctz 80-25mg  po qd Good effect. No AEs. Denies CV symptoms including: chest pain, shob, doe, headache, visual changes, fatigue, claudication, and dependent edema.   Previous readings and labs: BP Readings from Last 3 Encounters:  09/15/21 (!) 146/90  06/22/21 (!) 139/94  06/12/21 (!) 161/107   Lab Results  Component Value Date   CREATININE 1.00 07/09/2021    Home numbers:  130/92, 142/94, 132/85  HM Due for hep c screen, shingrix, tdap. Willing to get these today.   Outpatient Medications Prior to Visit  Medication Sig Dispense Refill   aspirin EC 81 MG EC tablet Take 1 tablet (81 mg total) by mouth daily. Swallow whole. 30 tablet 1   atorvastatin (LIPITOR) 40 MG tablet Take 1 tablet (40 mg total) by mouth daily. 90 tablet 0   Multiple Vitamin (MULTIVITAMIN) tablet Take 1 tablet by mouth daily.     telmisartan-hydrochlorothiazide (MICARDIS HCT) 80-25 MG tablet Take 1 tablet by mouth daily. 90 tablet 0   thiamine 100 MG tablet Take 1 tablet (100 mg total) by mouth daily. 30 tablet 1   vitamin B-12 (CYANOCOBALAMIN) 1000 MCG tablet Take 1 tablet (1,000 mcg total) by mouth daily. 30 tablet 1   tiZANidine (ZANAFLEX) 2 MG tablet Take 1 tablet (2 mg total) by mouth every 6 (six) hours as needed for muscle spasms. (Patient not taking: Reported on 09/09/2021) 60 tablet 0   No facility-administered medications prior to visit.    Review of Systems  Constitutional: Negative.   HENT: Negative.    Eyes: Negative.   Respiratory: Negative.    Cardiovascular: Negative.   Gastrointestinal: Negative.   Genitourinary: Negative.   Musculoskeletal: Negative.    Skin: Negative.   Neurological: Negative.   Psychiatric/Behavioral: Negative.    All other systems reviewed and are negative.    Objective:     BP (!) 146/90   Pulse 92   Temp 98.3 F (36.8 C) (Temporal)   Resp 18   Ht 5\' 11"  (1.803 m)   Wt 213 lb 3.2 oz (96.7 kg)   SpO2 99%   BMI 29.74 kg/m   Wt Readings from Last 3 Encounters:  09/15/21 213 lb 3.2 oz (96.7 kg)  09/09/21 210 lb (95.3 kg)  06/22/21 207 lb (93.9 kg)   Physical Exam Constitutional:      General: He is not in acute distress.    Appearance: Normal appearance. He is normal weight. He is not ill-appearing, toxic-appearing or diaphoretic.  Cardiovascular:     Rate and Rhythm: Normal rate and regular rhythm.     Heart sounds: Normal heart sounds. No murmur heard.   No friction rub. No gallop.  Pulmonary:     Effort: Pulmonary effort is normal. No respiratory distress.     Breath sounds: Normal breath sounds. No stridor. No wheezing, rhonchi or rales.  Chest:     Chest wall: No tenderness.  Neurological:     General: No focal deficit present.     Mental Status: He is alert and oriented to person, place, and time. Mental status is at baseline.  Psychiatric:  Mood and Affect: Mood normal.        Behavior: Behavior normal.        Thought Content: Thought content normal.        Judgment: Judgment normal.    No results found for any visits on 09/15/21.    The ASCVD Risk score (Arnett DK, et al., 2019) failed to calculate for the following reasons:   The patient has a prior MI or stroke diagnosis    Assessment & Plan:   Problem List Items Addressed This Visit       Cardiovascular and Mediastinum   Essential hypertension - Primary    Uncontrolled. Add nifedipine 60mg  ER po qd. Recheck in 2-3 weeks at nurse visit with labs, fasting. If controlled - OV in 6 mo. If not, 3 mo. Discussed lifestyle modifications for BP control. He is doing well with tobacco cessation and alcohol reduction.        Relevant Medications   NIFEdipine (PROCARDIA XL/NIFEDICAL XL) 60 MG 24 hr tablet   Other Relevant Orders   Lipid panel   Acute ischemic stroke (HCC)   Relevant Medications   NIFEdipine (PROCARDIA XL/NIFEDICAL XL) 60 MG 24 hr tablet   Other Relevant Orders   Lipid panel   Other Visit Diagnoses     Need for shingles vaccine       Relevant Orders   Varicella-zoster vaccine IM (Completed)   Need for Tdap vaccination       Relevant Orders   Tdap vaccine greater than or equal to 7yo IM (Completed)   Encounter for screening for other viral diseases       Relevant Orders   Hepatitis C Antibody       Meds ordered this encounter  Medications   NIFEdipine (PROCARDIA XL/NIFEDICAL XL) 60 MG 24 hr tablet    Sig: Take 1 tablet (60 mg total) by mouth daily.    Dispense:  90 tablet    Refill:  0    Order Specific Question:   Supervising Provider    Answer:   , JEFFREY R [[id number]]    Return in about 6 weeks (around 10/27/2021) for Nurse visit BP check and labs (FASTING), and then 6 mo follow up with me.   PLAN Vaccines given as above Hep c and lipid panel futured for collection when fasting.  See problem based charting Patient encouraged to call clinic with any questions, comments, or concerns.   12/28/2021, NP

## 2021-09-15 NOTE — Telephone Encounter (Signed)
Sent immunization/vaccine orders to scan.

## 2021-09-15 NOTE — Assessment & Plan Note (Signed)
Uncontrolled. Add nifedipine 60mg  ER po qd. Recheck in 2-3 weeks at nurse visit with labs, fasting. If controlled - OV in 6 mo. If not, 3 mo. Discussed lifestyle modifications for BP control. He is doing well with tobacco cessation and alcohol reduction.

## 2021-09-21 ENCOUNTER — Encounter: Payer: Self-pay | Admitting: Certified Registered Nurse Anesthetist

## 2021-09-22 ENCOUNTER — Encounter: Payer: Self-pay | Admitting: Gastroenterology

## 2021-09-24 ENCOUNTER — Encounter: Payer: Self-pay | Admitting: Certified Registered Nurse Anesthetist

## 2021-09-24 ENCOUNTER — Encounter: Payer: Self-pay | Admitting: Registered Nurse

## 2021-09-24 NOTE — Telephone Encounter (Signed)
Pt called in stating that he stopped taking the bp medication due to the swelling and the rapid heart rate. Please advise.   Pt wanted to know if he should be taking something else or just wait.

## 2021-09-25 ENCOUNTER — Encounter: Payer: Self-pay | Admitting: Gastroenterology

## 2021-09-25 ENCOUNTER — Ambulatory Visit (AMBULATORY_SURGERY_CENTER): Payer: 59 | Admitting: Gastroenterology

## 2021-09-25 VITALS — BP 130/80 | HR 81 | Temp 98.4°F | Resp 16 | Ht 71.0 in | Wt 210.0 lb

## 2021-09-25 DIAGNOSIS — R195 Other fecal abnormalities: Secondary | ICD-10-CM

## 2021-09-25 DIAGNOSIS — Z1211 Encounter for screening for malignant neoplasm of colon: Secondary | ICD-10-CM

## 2021-09-25 MED ORDER — SODIUM CHLORIDE 0.9 % IV SOLN: 500.0000 mL | Freq: Once | INTRAVENOUS | Status: DC

## 2021-09-25 NOTE — Patient Instructions (Signed)
Read all of the handouts given to you by your recovery room nurse. ? ?YOU HAD AN ENDOSCOPIC PROCEDURE TODAY AT THE [facility]:   Refer to the procedure report that was given to you for any specific questions about what was found during the examination.  If the procedure report does not answer your questions, please call your gastroenterologist to clarify.  If you requested that your care partner not be given the details of your procedure findings, then the procedure report has been included in a sealed envelope for you to review at your convenience later. ? ?YOU SHOULD EXPECT: Some feelings of bloating in the abdomen. Passage of more gas than usual.  Walking can help get rid of the air that was put into your GI tract during the procedure and reduce the bloating. If you had a lower endoscopy (such as a colonoscopy or flexible sigmoidoscopy) you may notice spotting of blood in your stool or on the toilet paper. If you underwent a bowel prep for your procedure, you may not have a normal bowel movement for a few days. ? ?Please Note:  You might notice some irritation and congestion in your nose or some drainage.  This is from the oxygen used during your procedure.  There is no need for concern and it should clear up in a day or so. ? ?SYMPTOMS TO REPORT IMMEDIATELY: ? ?Following lower endoscopy (colonoscopy or flexible sigmoidoscopy): ? Excessive amounts of blood in the stool ? Significant tenderness or worsening of abdominal pains ? Swelling of the abdomen that is new, acute ? Fever of 100?F or higher ? ?  ?For urgent or emergent issues, a gastroenterologist can be reached at any hour by calling [phone]. ?Do not use MyChart messaging for urgent concerns.  ? ? ?DIET:  We do recommend a small meal at first, but then you may proceed to your regular diet.  Drink plenty of fluids but you should avoid alcoholic beverages for 24 hours. ? ?ACTIVITY:  You should plan to take it easy for the rest of today  and you should NOT DRIVE or use heavy machinery until tomorrow (because of the sedation medicines used during the test).   ? ?FOLLOW UP: ?Our staff will call the number listed on your records 48-72 hours following your procedure to check on you and address any questions or concerns that you may have regarding the information given to you following your procedure. If we do not reach you, we will leave a message.  We will attempt to reach you two times.  During this call, we will ask if you have developed any symptoms of COVID 19. If you develop any symptoms (ie: fever, flu-like symptoms, shortness of breath, cough etc.) before then, please call [phone].  If you test positive for Covid 19 in the 2 weeks post procedure, please call and report this information to us.   ? ?If any biopsies were taken you will be contacted by phone or by letter within the next 1-3 weeks.  Please call us at [phone] if you have not heard about the biopsies in 3 weeks.  ? ? ?SIGNATURES/CONFIDENTIALITY: ?You and/or your care partner have signed paperwork which will be entered into your electronic medical record.  These signatures attest to the fact that that the information above on your After Visit Summary has been reviewed and is understood.  Full responsibility of the confidentiality of this discharge information lies with you and/or your care-partner.  ?

## 2021-09-25 NOTE — Progress Notes (Signed)
HPI: This is a man with cologuard + stool   ROS: complete GI ROS as described in HPI, all other review negative.  Constitutional:  No unintentional weight loss   Past Medical History:  Diagnosis Date   HTN (hypertension)    Hyperlipidemia    Neuromuscular disorder (HCC)    numbness hand neck foot left side since stroke 04-2021   Stroke (HCC) 04/2021   took Plavix x 21 days post stroke, now ASA only    Past Surgical History:  Procedure Laterality Date   NOSE SURGERY     TONSILLECTOMY      Current Outpatient Medications  Medication Sig Dispense Refill   aspirin EC 81 MG EC tablet Take 1 tablet (81 mg total) by mouth daily. Swallow whole. 30 tablet 1   atorvastatin (LIPITOR) 40 MG tablet Take 1 tablet (40 mg total) by mouth daily. 90 tablet 0   Multiple Vitamin (MULTIVITAMIN) tablet Take 1 tablet by mouth daily.     telmisartan-hydrochlorothiazide (MICARDIS HCT) 80-25 MG tablet Take 1 tablet by mouth daily. 90 tablet 0   vitamin B-12 (CYANOCOBALAMIN) 1000 MCG tablet Take 1 tablet (1,000 mcg total) by mouth daily. 30 tablet 1   thiamine 100 MG tablet Take 1 tablet (100 mg total) by mouth daily. (Patient not taking: Reported on 09/25/2021) 30 tablet 1   Current Facility-Administered Medications  Medication Dose Route Frequency Provider Last Rate Last Admin   0.9 %  sodium chloride infusion  500 mL Intravenous Once Rachael Fee, MD        Allergies as of 09/25/2021 - Review Complete 09/25/2021  Allergen Reaction Noted   Procardia xl [nifedipine er] Other (See Comments) 09/25/2021    Family History  Problem Relation Age of Onset   Hypertension Mother    Hyperlipidemia Mother    Heart failure Mother    COPD Father        smoker   Diabetes Father    Hypertension Father    Hypertension Brother    Colon cancer Neg Hx    Colon polyps Neg Hx    Esophageal cancer Neg Hx    Rectal cancer Neg Hx    Stomach cancer Neg Hx     Social History   Socioeconomic History    Marital status: Divorced    Spouse name: Not on file   Number of children: Not on file   Years of education: Not on file   Highest education level: Not on file  Occupational History   Not on file  Tobacco Use   Smoking status: Former    Types: Cigarettes    Quit date: 2018    Years since quitting: 5.4   Smokeless tobacco: Current    Types: Chew   Tobacco comments:    Herbal packs per pt-  random use   Substance and Sexual Activity   Alcohol use: Yes    Comment: 3-4 beers per day + Vodka   Drug use: Yes    Types: Benzodiazepines   Sexual activity: Not on file  Other Topics Concern   Not on file  Social History Narrative   Not on file   Social Determinants of Health   Financial Resource Strain: Not on file  Food Insecurity: Not on file  Transportation Needs: Not on file  Physical Activity: Not on file  Stress: Not on file  Social Connections: Not on file  Intimate Partner Violence: Not on file     Physical Exam: BP 125/78  Pulse 90   Temp 98.4 F (36.9 C)   Ht 5\' 11"  (1.803 m)   Wt 210 lb (95.3 kg)   SpO2 97%   BMI 29.29 kg/m  Constitutional: generally well-appearing Psychiatric: alert and oriented x3 Lungs: CTA bilaterally Heart: no MCR  Assessment and plan: 56 y.o. male with cologuard + stool  Colonboscopy today  Care is appropriate for the ambulatory setting.  56, MD [facility] 09/25/2021, 8:17 AM

## 2021-09-25 NOTE — Op Note (Addendum)
[facility] Patient Name: Cameron Herrera Procedure Date: 09/25/2021 8:21 AM MRN: [id number] Endoscopist: Rachael Fee , MD Age: 56 Referring MD:  Date of Birth: 12-Aug-1965 Gender: Male Account #: [id number] Procedure:                Colonoscopy Indications:              + cologuard colon cancer screening test Medicines:                Monitored Anesthesia Care Procedure:                Pre-Anesthesia Assessment:                           - Prior to the procedure, a History and Physical                            was performed, and patient medications and                            allergies were reviewed. The patient's tolerance of                            previous anesthesia was also reviewed. The risks                            and benefits of the procedure and the sedation                            options and risks were discussed with the patient.                            All questions were answered, and informed consent                            was obtained. Prior Anticoagulants: The patient has                            taken no previous anticoagulant or antiplatelet                            agents. ASA Grade Assessment: III - A patient with                            severe systemic disease. After reviewing the risks                            and benefits, the patient was deemed in                            satisfactory condition to undergo the procedure.                           After obtaining informed consent, the colonoscope  was passed under direct vision. Throughout the                            procedure, the patient's blood pressure, pulse, and                            oxygen saturations were monitored continuously. The                            Olympus CF-HQ190L (Serial# [id number]) Colonoscope was                            introduced through the anus and advanced to the the                            cecum, identified by  appendiceal orifice and                            ileocecal valve. The colonoscopy was performed                            without difficulty. The patient tolerated the                            procedure well. The quality of the bowel                            preparation was good. The ileocecal valve,                            appendiceal orifice, and rectum were photographed. Scope In: 8:25:01 AM Scope Out: 8:35:52 AM Scope Withdrawal Time: 0 hours 9 minutes 7 seconds  Total Procedure Duration: 0 hours 10 minutes 51 seconds  Findings:                 The entire examined colon appeared normal on direct                            and retroflexion views. Complications:            No immediate complications. Estimated blood loss:                            None. Estimated Blood Loss:     Estimated blood loss: none. Impression:               - The entire examined colon is normal on direct and                            retroflexion views.                           - No polyps or cancers. Recommendation:           - Patient has a contact number available for  emergencies. The signs and symptoms of potential                            delayed complications were discussed with the                            patient. Return to normal activities tomorrow.                            Written discharge instructions were provided to the                            patient.                           - Resume previous diet.                           - Continue present medications.                           - Repeat colonoscopy in 10 years for screening. Rachael Feeaniel P , MD 09/25/2021 8:37:55 AM This report has been signed electronically.

## 2021-09-25 NOTE — Progress Notes (Signed)
Report given to PACU, vss 

## 2021-09-25 NOTE — Progress Notes (Signed)
Pt stated that he was put on procardia for BP elevation 09/17/21 ,took it for 2-3 days heart rate elevated  & ankles became very swollen. Pt stopped Procardia and informed his PCP . Ankles remain swollen ,but patient states they are less swollen than they were and heart rate back to normal. CRNA,C Wall notified of all of this and that pt had a Stroke January 2023. Pt has left sided numbness and tingling ,mainly in left hand and left foot since stroke. Pt was on Plavix for 21 days after stroke.

## 2021-09-28 ENCOUNTER — Telehealth: Payer: Self-pay | Admitting: *Deleted

## 2021-09-28 ENCOUNTER — Telehealth: Payer: Self-pay

## 2021-09-28 NOTE — Telephone Encounter (Signed)
Second post procedure follow up call, no answer 

## 2021-09-28 NOTE — Telephone Encounter (Signed)
Left message on f/u call 

## 2021-10-29 ENCOUNTER — Other Ambulatory Visit (INDEPENDENT_AMBULATORY_CARE_PROVIDER_SITE_OTHER): Payer: 59

## 2021-10-29 DIAGNOSIS — I10 Essential (primary) hypertension: Secondary | ICD-10-CM

## 2021-10-29 DIAGNOSIS — Z1159 Encounter for screening for other viral diseases: Secondary | ICD-10-CM

## 2021-10-29 DIAGNOSIS — I639 Cerebral infarction, unspecified: Secondary | ICD-10-CM

## 2021-10-29 LAB — LIPID PANEL
Cholesterol: 115 mg/dL (ref 0–200)
HDL: 39.8 mg/dL (ref >39.00)
LDL Cholesterol: 39 mg/dL (ref 0–99)
NonHDL: 75.47
Total CHOL/HDL Ratio: 3
Triglycerides: 181 mg/dL — ABNORMAL HIGH (ref 0.0–149.0)
VLDL: 36.2 mg/dL (ref 0.0–40.0)

## 2021-10-30 LAB — HEPATITIS C ANTIBODY: Hepatitis C Ab: NONREACTIVE

## 2021-11-03 ENCOUNTER — Telehealth: Payer: Self-pay | Admitting: Hematology

## 2021-11-03 NOTE — Telephone Encounter (Signed)
Left message with rescheduled upcoming appointment due to provider's template. 

## 2021-11-05 ENCOUNTER — Other Ambulatory Visit: Payer: Self-pay | Admitting: Registered Nurse

## 2021-11-05 DIAGNOSIS — I10 Essential (primary) hypertension: Secondary | ICD-10-CM

## 2021-11-05 DIAGNOSIS — I639 Cerebral infarction, unspecified: Secondary | ICD-10-CM

## 2022-01-20 ENCOUNTER — Inpatient Hospital Stay: Payer: 59

## 2022-01-20 ENCOUNTER — Inpatient Hospital Stay: Payer: 59 | Admitting: Hematology

## 2022-01-22 ENCOUNTER — Other Ambulatory Visit: Payer: 59

## 2022-01-22 ENCOUNTER — Ambulatory Visit: Payer: 59 | Admitting: Hematology

## 2022-02-09 ENCOUNTER — Other Ambulatory Visit: Payer: Self-pay

## 2022-02-09 ENCOUNTER — Other Ambulatory Visit: Payer: Self-pay | Admitting: Family Medicine

## 2022-02-09 DIAGNOSIS — I10 Essential (primary) hypertension: Secondary | ICD-10-CM

## 2022-02-09 DIAGNOSIS — I639 Cerebral infarction, unspecified: Secondary | ICD-10-CM

## 2022-02-09 MED ORDER — ATORVASTATIN CALCIUM 40 MG PO TABS: 40.0000 mg | ORAL_TABLET | Freq: Every day | ORAL | 3 refills | Status: DC

## 2022-02-09 MED ORDER — TELMISARTAN-HCTZ 80-25 MG PO TABS: 1.0000 | ORAL_TABLET | Freq: Every day | ORAL | 0 refills | Status: DC

## 2022-03-22 ENCOUNTER — Ambulatory Visit: Payer: 59 | Admitting: Registered Nurse

## 2022-05-03 ENCOUNTER — Other Ambulatory Visit: Payer: Self-pay

## 2022-05-03 ENCOUNTER — Inpatient Hospital Stay (HOSPITAL_BASED_OUTPATIENT_CLINIC_OR_DEPARTMENT_OTHER): Payer: 59 | Admitting: Hematology

## 2022-05-03 ENCOUNTER — Inpatient Hospital Stay: Payer: 59 | Attending: Hematology

## 2022-05-03 VITALS — BP 134/87 | HR 91 | Temp 97.7°F | Resp 20 | Wt 211.6 lb

## 2022-05-03 DIAGNOSIS — D751 Secondary polycythemia: Secondary | ICD-10-CM

## 2022-05-03 DIAGNOSIS — R7989 Other specified abnormal findings of blood chemistry: Secondary | ICD-10-CM | POA: Diagnosis not present

## 2022-05-03 DIAGNOSIS — E538 Deficiency of other specified B group vitamins: Secondary | ICD-10-CM | POA: Diagnosis not present

## 2022-05-03 LAB — CMP (CANCER CENTER ONLY)
ALT: 30 U/L (ref 0–44)
AST: 20 U/L (ref 15–41)
Albumin: 4.4 g/dL (ref 3.5–5.0)
Alkaline Phosphatase: 77 U/L (ref 38–126)
Anion gap: 6 (ref 5–15)
BUN: 15 mg/dL (ref 6–20)
CO2: 30 mmol/L (ref 22–32)
Calcium: 9.4 mg/dL (ref 8.9–10.3)
Chloride: 100 mmol/L (ref 98–111)
Creatinine: 0.98 mg/dL (ref 0.61–1.24)
GFR, Estimated: >60 mL/min (ref >60)
Glucose, Bld: 101 mg/dL — ABNORMAL HIGH (ref 70–99)
Potassium: 3.5 mmol/L (ref 3.5–5.1)
Sodium: 136 mmol/L (ref 135–145)
Total Bilirubin: 1.2 mg/dL (ref 0.3–1.2)
Total Protein: 6.6 g/dL (ref 6.5–8.1)

## 2022-05-03 LAB — IRON AND IRON BINDING CAPACITY (CC-WL,HP ONLY)
Iron: 197 ug/dL — ABNORMAL HIGH (ref 45–182)
Saturation Ratios: 68 % — ABNORMAL HIGH (ref 17.9–39.5)
TIBC: 291 ug/dL (ref 250–450)
UIBC: 94 ug/dL — ABNORMAL LOW (ref 117–376)

## 2022-05-03 LAB — FERRITIN: Ferritin: 949 ng/mL — ABNORMAL HIGH (ref 24–336)

## 2022-05-03 LAB — CBC WITH DIFFERENTIAL (CANCER CENTER ONLY)
Abs Immature Granulocytes: 0.02 10*3/uL (ref 0.00–0.07)
Basophils Absolute: 0 10*3/uL (ref 0.0–0.1)
Basophils Relative: 1 %
Eosinophils Absolute: 0.1 10*3/uL (ref 0.0–0.5)
Eosinophils Relative: 1 %
HCT: 42.9 % (ref 39.0–52.0)
Hemoglobin: 15.8 g/dL (ref 13.0–17.0)
Immature Granulocytes: 0 %
Lymphocytes Relative: 27 %
Lymphs Abs: 1.6 10*3/uL (ref 0.7–4.0)
MCH: 32.6 pg (ref 26.0–34.0)
MCHC: 36.8 g/dL — ABNORMAL HIGH (ref 30.0–36.0)
MCV: 88.5 fL (ref 80.0–100.0)
Monocytes Absolute: 0.7 10*3/uL (ref 0.1–1.0)
Monocytes Relative: 11 %
Neutro Abs: 3.7 10*3/uL (ref 1.7–7.7)
Neutrophils Relative %: 60 %
Platelet Count: 260 10*3/uL (ref 150–400)
RBC: 4.85 MIL/uL (ref 4.22–5.81)
RDW: 11.9 % (ref 11.5–15.5)
WBC Count: 6.1 10*3/uL (ref 4.0–10.5)
nRBC: 0 % (ref 0.0–0.2)

## 2022-05-03 NOTE — Progress Notes (Signed)
[name]   HEMATOLOGY/ONCOLOGY CLINIC VISIT NOTE  Date of Service: .05/03/2022  Patient Care Team: Maximiano Coss, NP as PCP - General (Adult Health Nurse Practitioner)  CHIEF COMPLAINTS/PURPOSE OF CONSULTATION:  Follow-up for continued evaluation and management of polycythemia and hemochromatosis  HISTORY OF PRESENTING ILLNESS:  Cameron Herrera is a wonderful 57 y.o. male who has been referred to [place] by Dr .Maximiano Coss, NP  for evaluation and management of polycythemia  Patient last had labs with his primary care physician on 05/11/2021 which showed hemoglobin of 17.4 with a hematocrit of 51.7 with a WBC count of 7.1k and platelets of 241k.  Review of labs showed that he has had elevated hemoglobin and hematocrit levels since at least September 2021.  Patient notes no chest pain or shortness of breath.  No new leg pain or swelling. No change in vision. No headaches or new focal neurological deficits.  Off smokeless tobacco No smoked tobacco since 2018 No VTE No history of MI CVA 04/2021  ETOH -  2-10 drinks /day vodka/whisky-- cutting down.  HCTZ No sleep apnea.  No testosterone supplementation.  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 He notes no acute new symptoms since his last clinic visit. He notes that he has more or less recovered from his stroke in 2023 and has minimal left leg clumsiness at times. He still drinks about 2-3 beers about 4 times a week. Notes that he has cut back on his red meat intake. He reports that he has been drinking more water as per the recommendations. He notes that he was started on Procardia XL for his hypertension but developed leg swelling and so this was discontinued by PCP. Labs done today were discussed in detail with him.  Past Medical History:  Diagnosis Date   HTN (hypertension)   CVA 2023 - [facility]  ETOH  excess B12 deficiency  SURGICAL HISTORY: -Broken nose -tonsillectomy 2007  SOCIAL HISTORY: Social History   Socioeconomic History   Marital status: Divorced    Spouse name: Not on file   Number of children: Not on file   Years of education: Not on file   Highest education level: Not on file  Occupational History   Not on file  Tobacco Use   Smoking status: Former    Types: Cigarettes    Quit date: 2018    Years since quitting: 5.2   Smokeless tobacco: Current    Types: Chew  Substance and Sexual Activity   Alcohol use: Yes    Comment: 3-4 beers per day + Vodka   Drug use: Never   Sexual activity: Not on file  Other Topics Concern   Not on file  Social History Narrative   Not on file   Social Determinants of Health   Financial Resource Strain: Not on file  Food Insecurity: Not on file  Transportation Needs: Not on file  Physical Activity: Not on file  Stress: Not on file  Social Connections: Not on file  Intimate Partner Violence: Not on file  Ex firefighter   FAMILY HISTORY: Family History  Problem Relation Age of Onset   Hypertension Mother    Hyperlipidemia Mother    Heart failure Mother    COPD Father        smoker   Diabetes Father    Hypertension Father    Hypertension Brother  Mother - CAD- died young  ALLERGIES:  has no allergies on file.  MEDICATIONS:  Current Outpatient Medications  Medication Sig Dispense Refill   aspirin EC 81 MG EC tablet Take 1 tablet (81 mg total) by mouth daily. Swallow whole. 30 tablet 1   atorvastatin (LIPITOR) 40 MG tablet Take 1 tablet (40 mg total) by mouth daily. 90 tablet 1   Multiple Vitamin (MULTIVITAMIN) tablet Take 1 tablet by mouth daily.     telmisartan-hydrochlorothiazide (MICARDIS HCT) 80-25 MG tablet Take 1 tablet by mouth daily. 90 tablet 1   thiamine 100 MG tablet Take 1 tablet (100 mg total) by mouth daily. 30 tablet 1   tiZANidine (ZANAFLEX) 2 MG tablet Take 1 tablet (2 mg total) by mouth every  6 (six) hours as needed for muscle spasms. 60 tablet 0   vitamin B-12 (CYANOCOBALAMIN) 1000 MCG tablet Take 1 tablet (1,000 mcg total) by mouth daily. 30 tablet 1   No current facility-administered medications for this visit.    REVIEW OF SYSTEMS:    10 Point review of Systems was done is negative except as noted above.  PHYSICAL EXAMINATION: .BP 134/87   Pulse 91   Temp 97.7 F (36.5 C)   Resp 20   Wt 211 lb 9.6 oz (96 kg)   SpO2 99%   BMI 29.51 kg/m  . GENERAL:alert, in no acute distress and comfortable SKIN: no acute rashes, no significant lesions EYES: conjunctiva are pink and non-injected, sclera anicteric OROPHARYNX: MMM, no exudates, no oropharyngeal erythema or ulceration NECK: supple, no JVD LYMPH:  no palpable lymphadenopathy in the cervical, axillary or inguinal regions LUNGS: clear to auscultation b/l with normal respiratory effort HEART: regular rate & rhythm ABDOMEN:  normoactive bowel sounds , non tender, not distended. Extremity: no pedal edema PSYCH: alert & oriented x 3 with fluent speech NEURO: no focal motor/sensory deficits   LABORATORY DATA:  I have reviewed the data as listed  .    Latest Ref Rng & Units 05/03/2022   10:13 AM 07/09/2021    8:09 AM 06/02/2021    1:55 PM  CBC  WBC 4.0 - 10.5 K/uL 6.1  6.3  6.9   Hemoglobin 13.0 - 17.0 g/dL 09.3  26.7  12.4   Hematocrit 39.0 - 52.0 % 42.9  42.9  44.9   Platelets 150 - 400 K/uL 260  238  202     .    Latest Ref Rng & Units 05/03/2022   10:13 AM 07/09/2021    8:09 AM 06/02/2021    1:55 PM  CMP  Glucose 70 - 99 mg/dL 580  998  338   BUN 6 - 20 mg/dL 15  15  13    Creatinine 0.61 - 1.24 mg/dL  2.50  5.39   Sodium 135 - 145 mmol/L 136  140  136   Potassium 3.5 - 5.1 mmol/L 3.5  3.9  3.4   Chloride 98 - 111 mmol/L 100  104  102   CO2 22 - 32 mmol/L 30  30  25    Calcium 8.9 - 10.3 mg/dL 9.4  9.0  9.0   Total Protein 6.5 - 8.1 g/dL 6.6  6.8  6.9   Total Bilirubin 0.3 - 1.2 mg/dL 1.2  1.4  1.5    Alkaline Phos 38 - 126 U/L 77  78  104   AST 15 - 41 U/L 20  20  23    ALT 0 - 44 U/L 30  32  48    . Lab Results  Component Value Date   IRON 197 (H) 05/03/2022   TIBC 291 05/03/2022   IRONPCTSAT 68 (H) 05/03/2022   (Iron and TIBC)  Lab Results  Component Value Date   FERRITIN 949 (H) 05/03/2022   Component     Latest Ref Rng & Units 06/02/2021  Erythropoietin     2.6 - 18.5 mIU/mL 7.8     -DNA/PCR done 07/09/2021 revealed "c.187C>G (p.His63Asp) - Detected, heterozygous" Hereditary Hemochromatosis confirmed.  RADIOGRAPHIC STUDIES: I have personally reviewed the radiological images as listed and agreed with the findings in the report.  ASSESSMENT & PLAN:   57 year old male with  1) Polycythemia -likely secondary to hemoconcentration from heavy alcohol use, decreased p.o. 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  Patient's JAK2 mutation testing is negative which makes it less than 1% likely to be polycythemia vera.  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 Lab Results  Component Value Date   IRON 197 (H) 05/03/2022   TIBC 291 05/03/2022   IRONPCTSAT 68 (H) 05/03/2022   (Iron and TIBC)  Lab Results  Component Value Date   FERRITIN 949 (H) 05/03/2022   PLAN -Patient's labs done today were discussed in details. -He notes that he has donated 1 unit of PRBC in September 2023. -CBC shows complete normal blood counts with no evidence of persistent polycythemia.  This has resolved with increased p.o. fluid intake. -CMP within normal limits -Iron saturation is up from 55 to 68% .  And ferritin is up to 949 -We shall set him up for therapeutic phlebotomies monthly x 4 and closely monitor for improvement in his ferritin levels. -Counseled on minimizing and potentially abstaining from alcohol use. -Avoid any iron  supplementation. -Continues to be on B12 replacement for B12 deficiency and also is taking a B complex.  Notes good energy level. -Continue follow-up with PCP  Follow-up Therapeutic phlebotomy every 2 months x 4 starting soon as possible return to clinic with Dr. Candise Che in 6 months  The total time spent in the appointment was 20 minutes*.  All of the patient's questions were answered with apparent satisfaction. The patient knows to call the clinic with any problems, questions or concerns.   Wyvonnia Lora MD MS AAHIVMS [facility] Hematology/Oncology Physician [facility]  .*Total Encounter Time as defined by the Centers for Medicare and Medicaid Services includes, in addition to the face-to-face time of a patient visit (documented in the note above) non-face-to-face time: obtaining and reviewing outside history, ordering and reviewing medications, tests or procedures, care coordination (communications with other health care professionals or caregivers) and documentation in the medical record.

## 2022-05-08 ENCOUNTER — Other Ambulatory Visit: Payer: Self-pay | Admitting: Family Medicine

## 2022-05-08 DIAGNOSIS — I10 Essential (primary) hypertension: Secondary | ICD-10-CM

## 2022-05-08 DIAGNOSIS — I639 Cerebral infarction, unspecified: Secondary | ICD-10-CM

## 2022-05-09 ENCOUNTER — Encounter: Payer: Self-pay | Admitting: Hematology

## 2022-05-09 NOTE — Addendum Note (Signed)
Addended by: Sullivan Lone on: 05/09/2022 11:43 PM   Modules accepted: Orders

## 2022-05-10 ENCOUNTER — Other Ambulatory Visit: Payer: Self-pay

## 2022-05-10 DIAGNOSIS — I639 Cerebral infarction, unspecified: Secondary | ICD-10-CM

## 2022-05-10 DIAGNOSIS — I10 Essential (primary) hypertension: Secondary | ICD-10-CM

## 2022-05-10 LAB — HEMOCHROMATOSIS DNA-PCR(C282Y,H63D)

## 2022-05-10 MED ORDER — TELMISARTAN-HCTZ 80-25 MG PO TABS: 1.0000 | ORAL_TABLET | Freq: Every day | ORAL | 0 refills | Status: DC

## 2022-05-17 ENCOUNTER — Inpatient Hospital Stay: Payer: 59

## 2022-05-17 ENCOUNTER — Other Ambulatory Visit: Payer: Self-pay

## 2022-05-17 DIAGNOSIS — D751 Secondary polycythemia: Secondary | ICD-10-CM | POA: Diagnosis not present

## 2022-05-17 LAB — CBC WITH DIFFERENTIAL (CANCER CENTER ONLY)
Abs Immature Granulocytes: 0.04 10*3/uL (ref 0.00–0.07)
Basophils Absolute: 0 10*3/uL (ref 0.0–0.1)
Basophils Relative: 1 %
Eosinophils Absolute: 0.1 10*3/uL (ref 0.0–0.5)
Eosinophils Relative: 2 %
HCT: 41.3 % (ref 39.0–52.0)
Hemoglobin: 15 g/dL (ref 13.0–17.0)
Immature Granulocytes: 1 %
Lymphocytes Relative: 24 %
Lymphs Abs: 1.4 10*3/uL (ref 0.7–4.0)
MCH: 32.3 pg (ref 26.0–34.0)
MCHC: 36.3 g/dL — ABNORMAL HIGH (ref 30.0–36.0)
MCV: 89 fL (ref 80.0–100.0)
Monocytes Absolute: 0.5 10*3/uL (ref 0.1–1.0)
Monocytes Relative: 9 %
Neutro Abs: 3.7 10*3/uL (ref 1.7–7.7)
Neutrophils Relative %: 63 %
Platelet Count: 255 10*3/uL (ref 150–400)
RBC: 4.64 MIL/uL (ref 4.22–5.81)
RDW: 12.2 % (ref 11.5–15.5)
WBC Count: 5.8 10*3/uL (ref 4.0–10.5)
nRBC: 0 % (ref 0.0–0.2)

## 2022-05-17 NOTE — Patient Instructions (Signed)

## 2022-05-17 NOTE — Progress Notes (Signed)
Cameron Herrera presents today for phlebotomy per MD orders. Phlebotomy procedure started at 0907 and ended at 0916. 560 cc removed. Patient tolerated procedure well. IV needle removed intact. Patient VS stable, no c/o of lightheadedness, dizziness, nausea after 30 min observation period.  Ambulated independently to lobby.  Angie Fava, RN

## 2022-06-08 ENCOUNTER — Other Ambulatory Visit: Payer: Self-pay | Admitting: Family Medicine

## 2022-06-08 DIAGNOSIS — I639 Cerebral infarction, unspecified: Secondary | ICD-10-CM

## 2022-06-08 DIAGNOSIS — I10 Essential (primary) hypertension: Secondary | ICD-10-CM

## 2022-06-30 ENCOUNTER — Other Ambulatory Visit: Payer: Self-pay | Admitting: Family Medicine

## 2022-06-30 DIAGNOSIS — I10 Essential (primary) hypertension: Secondary | ICD-10-CM

## 2022-06-30 DIAGNOSIS — I639 Cerebral infarction, unspecified: Secondary | ICD-10-CM

## 2022-07-09 ENCOUNTER — Other Ambulatory Visit: Payer: Self-pay | Admitting: *Deleted

## 2022-07-09 DIAGNOSIS — D751 Secondary polycythemia: Secondary | ICD-10-CM

## 2022-07-12 ENCOUNTER — Inpatient Hospital Stay: Payer: 59 | Attending: Hematology

## 2022-07-12 ENCOUNTER — Inpatient Hospital Stay: Payer: 59

## 2022-07-12 DIAGNOSIS — D751 Secondary polycythemia: Secondary | ICD-10-CM

## 2022-07-12 LAB — CBC WITH DIFFERENTIAL (CANCER CENTER ONLY)
Abs Immature Granulocytes: 0.02 10*3/uL (ref 0.00–0.07)
Basophils Absolute: 0 10*3/uL (ref 0.0–0.1)
Basophils Relative: 1 %
Eosinophils Absolute: 0.1 10*3/uL (ref 0.0–0.5)
Eosinophils Relative: 2 %
HCT: 43.8 % (ref 39.0–52.0)
Hemoglobin: 15.8 g/dL (ref 13.0–17.0)
Immature Granulocytes: 0 %
Lymphocytes Relative: 30 %
Lymphs Abs: 1.8 10*3/uL (ref 0.7–4.0)
MCH: 32.2 pg (ref 26.0–34.0)
MCHC: 36.1 g/dL — ABNORMAL HIGH (ref 30.0–36.0)
MCV: 89.4 fL (ref 80.0–100.0)
Monocytes Absolute: 0.6 10*3/uL (ref 0.1–1.0)
Monocytes Relative: 10 %
Neutro Abs: 3.4 10*3/uL (ref 1.7–7.7)
Neutrophils Relative %: 57 %
Platelet Count: 231 10*3/uL (ref 150–400)
RBC: 4.9 MIL/uL (ref 4.22–5.81)
RDW: 11.9 % (ref 11.5–15.5)
WBC Count: 6 10*3/uL (ref 4.0–10.5)
nRBC: 0 % (ref 0.0–0.2)

## 2022-07-12 LAB — FERRITIN: Ferritin: 579 ng/mL — ABNORMAL HIGH (ref 24–336)

## 2022-07-12 LAB — CMP (CANCER CENTER ONLY)
ALT: 33 U/L (ref 0–44)
AST: 23 U/L (ref 15–41)
Albumin: 4.4 g/dL (ref 3.5–5.0)
Alkaline Phosphatase: 74 U/L (ref 38–126)
Anion gap: 8 (ref 5–15)
BUN: 14 mg/dL (ref 6–20)
CO2: 28 mmol/L (ref 22–32)
Calcium: 8.9 mg/dL (ref 8.9–10.3)
Chloride: 99 mmol/L (ref 98–111)
Creatinine: 0.96 mg/dL (ref 0.61–1.24)
GFR, Estimated: >60 mL/min (ref >60)
Glucose, Bld: 133 mg/dL — ABNORMAL HIGH (ref 70–99)
Potassium: 3.6 mmol/L (ref 3.5–5.1)
Sodium: 135 mmol/L (ref 135–145)
Total Bilirubin: 0.9 mg/dL (ref 0.3–1.2)
Total Protein: 7.2 g/dL (ref 6.5–8.1)

## 2022-07-12 LAB — IRON AND IRON BINDING CAPACITY (CC-WL,HP ONLY)
Iron: 136 ug/dL (ref 45–182)
Saturation Ratios: 42 % — ABNORMAL HIGH (ref 17.9–39.5)
TIBC: 321 ug/dL (ref 250–450)
UIBC: 185 ug/dL (ref 117–376)

## 2022-07-12 NOTE — Progress Notes (Signed)
Cameron Herrera presents today for phlebotomy per MD orders. Phlebotomy procedure started at 0814 and ended at 0819. 521 grams removed. 16G phlebotomy kit used in RAC. Patient observed for 30 minutes after procedure without any incident. Patient tolerated procedure well. VSS at time of discharge. Pt denied dizziness/SOB. Pt declined food and drink.  IV needle removed intact.

## 2022-07-12 NOTE — Patient Instructions (Signed)

## 2022-09-02 ENCOUNTER — Other Ambulatory Visit: Payer: Self-pay

## 2022-09-06 ENCOUNTER — Inpatient Hospital Stay: Payer: 59

## 2022-09-06 ENCOUNTER — Inpatient Hospital Stay: Payer: 59 | Attending: Hematology

## 2022-09-06 ENCOUNTER — Other Ambulatory Visit: Payer: Self-pay

## 2022-09-06 DIAGNOSIS — D751 Secondary polycythemia: Secondary | ICD-10-CM | POA: Diagnosis present

## 2022-09-06 LAB — CBC WITH DIFFERENTIAL (CANCER CENTER ONLY)
Abs Immature Granulocytes: 0.01 10*3/uL (ref 0.00–0.07)
Basophils Absolute: 0 10*3/uL (ref 0.0–0.1)
Basophils Relative: 0 %
Eosinophils Absolute: 0.1 10*3/uL (ref 0.0–0.5)
Eosinophils Relative: 3 %
HCT: 41.3 % (ref 39.0–52.0)
Hemoglobin: 15 g/dL (ref 13.0–17.0)
Immature Granulocytes: 0 %
Lymphocytes Relative: 34 %
Lymphs Abs: 1.6 10*3/uL (ref 0.7–4.0)
MCH: 32.9 pg (ref 26.0–34.0)
MCHC: 36.3 g/dL — ABNORMAL HIGH (ref 30.0–36.0)
MCV: 90.6 fL (ref 80.0–100.0)
Monocytes Absolute: 0.5 10*3/uL (ref 0.1–1.0)
Monocytes Relative: 11 %
Neutro Abs: 2.4 10*3/uL (ref 1.7–7.7)
Neutrophils Relative %: 52 %
Platelet Count: 240 10*3/uL (ref 150–400)
RBC: 4.56 MIL/uL (ref 4.22–5.81)
RDW: 11.9 % (ref 11.5–15.5)
WBC Count: 4.6 10*3/uL (ref 4.0–10.5)
nRBC: 0 % (ref 0.0–0.2)

## 2022-09-06 LAB — CMP (CANCER CENTER ONLY)
ALT: 31 U/L (ref 0–44)
AST: 24 U/L (ref 15–41)
Albumin: 4.2 g/dL (ref 3.5–5.0)
Alkaline Phosphatase: 65 U/L (ref 38–126)
Anion gap: 11 (ref 5–15)
BUN: 15 mg/dL (ref 6–20)
CO2: 25 mmol/L (ref 22–32)
Calcium: 8.8 mg/dL — ABNORMAL LOW (ref 8.9–10.3)
Chloride: 103 mmol/L (ref 98–111)
Creatinine: 1.02 mg/dL (ref 0.61–1.24)
GFR, Estimated: >60 mL/min (ref >60)
Glucose, Bld: 115 mg/dL — ABNORMAL HIGH (ref 70–99)
Potassium: 3.6 mmol/L (ref 3.5–5.1)
Sodium: 139 mmol/L (ref 135–145)
Total Bilirubin: 0.6 mg/dL (ref 0.3–1.2)
Total Protein: 6.8 g/dL (ref 6.5–8.1)

## 2022-09-06 LAB — IRON AND IRON BINDING CAPACITY (CC-WL,HP ONLY)
Iron: 99 ug/dL (ref 45–182)
Saturation Ratios: 32 % (ref 17.9–39.5)
TIBC: 311 ug/dL (ref 250–450)
UIBC: 212 ug/dL (ref 117–376)

## 2022-09-06 LAB — FERRITIN: Ferritin: 657 ng/mL — ABNORMAL HIGH (ref 24–336)

## 2022-09-06 NOTE — Patient Instructions (Signed)

## 2022-09-06 NOTE — Progress Notes (Signed)
Cameron Herrera presents today for phlebotomy per MD orders. 18G IV kit used in RAC. Phlebotomy procedure started at 0824 and ended at 0834. 501 grams removed. Patient observed for 30 minutes after procedure without any incident. Pt provided with several drinks post-procedure. Patient tolerated procedure well. IV needle removed intact.

## 2022-11-01 ENCOUNTER — Other Ambulatory Visit: Payer: Self-pay

## 2022-11-02 ENCOUNTER — Inpatient Hospital Stay: Payer: 59 | Admitting: Hematology

## 2022-11-02 ENCOUNTER — Other Ambulatory Visit: Payer: Self-pay

## 2022-11-02 ENCOUNTER — Inpatient Hospital Stay: Payer: 59

## 2022-11-02 ENCOUNTER — Inpatient Hospital Stay: Payer: 59 | Attending: Hematology

## 2022-11-02 DIAGNOSIS — R7989 Other specified abnormal findings of blood chemistry: Secondary | ICD-10-CM

## 2022-11-02 DIAGNOSIS — D45 Polycythemia vera: Secondary | ICD-10-CM | POA: Insufficient documentation

## 2022-11-02 LAB — CMP (CANCER CENTER ONLY)
ALT: 32 U/L (ref 0–44)
AST: 23 U/L (ref 15–41)
Albumin: 4.2 g/dL (ref 3.5–5.0)
Alkaline Phosphatase: 69 U/L (ref 38–126)
Anion gap: 7 (ref 5–15)
BUN: 16 mg/dL (ref 6–20)
CO2: 30 mmol/L (ref 22–32)
Calcium: 9.3 mg/dL (ref 8.9–10.3)
Chloride: 99 mmol/L (ref 98–111)
Creatinine: 1 mg/dL (ref 0.61–1.24)
GFR, Estimated: >60 mL/min (ref >60)
Glucose, Bld: 96 mg/dL (ref 70–99)
Potassium: 3.7 mmol/L (ref 3.5–5.1)
Sodium: 136 mmol/L (ref 135–145)
Total Bilirubin: 1.1 mg/dL (ref 0.3–1.2)
Total Protein: 7.3 g/dL (ref 6.5–8.1)

## 2022-11-02 LAB — CBC WITH DIFFERENTIAL (CANCER CENTER ONLY)
Abs Immature Granulocytes: 0.03 10*3/uL (ref 0.00–0.07)
Basophils Absolute: 0.1 10*3/uL (ref 0.0–0.1)
Basophils Relative: 1 %
Eosinophils Absolute: 0.1 10*3/uL (ref 0.0–0.5)
Eosinophils Relative: 1 %
HCT: 44.1 % (ref 39.0–52.0)
Hemoglobin: 16 g/dL (ref 13.0–17.0)
Immature Granulocytes: 1 %
Lymphocytes Relative: 29 %
Lymphs Abs: 1.8 10*3/uL (ref 0.7–4.0)
MCH: 32.4 pg (ref 26.0–34.0)
MCHC: 36.3 g/dL — ABNORMAL HIGH (ref 30.0–36.0)
MCV: 89.3 fL (ref 80.0–100.0)
Monocytes Absolute: 0.7 10*3/uL (ref 0.1–1.0)
Monocytes Relative: 11 %
Neutro Abs: 3.7 10*3/uL (ref 1.7–7.7)
Neutrophils Relative %: 57 %
Platelet Count: 254 10*3/uL (ref 150–400)
RBC: 4.94 MIL/uL (ref 4.22–5.81)
RDW: 12 % (ref 11.5–15.5)
WBC Count: 6.3 10*3/uL (ref 4.0–10.5)
nRBC: 0 % (ref 0.0–0.2)

## 2022-11-02 LAB — IRON AND IRON BINDING CAPACITY (CC-WL,HP ONLY)
Iron: 151 ug/dL (ref 45–182)
Saturation Ratios: 45 % — ABNORMAL HIGH (ref 17.9–39.5)
TIBC: 333 ug/dL (ref 250–450)
UIBC: 182 ug/dL (ref 117–376)

## 2022-11-02 LAB — FERRITIN: Ferritin: 695 ng/mL — ABNORMAL HIGH (ref 24–336)

## 2022-11-02 NOTE — Progress Notes (Signed)
Cameron Herrera presents today for phlebotomy per MD orders. Phlebotomy procedure started at 1035 and ended at 1040.  16 G Phlebotomy kit was used to the R AC.  526 grams removed. Patient observed for 30 minutes after procedure without any incident. Patient tolerated procedure well. Patient declined all snacks and beverages.  IV needle removed intact. VSS at discharge.  Patient ambulatory to lobby with no complaints at discharge.

## 2022-11-02 NOTE — Patient Instructions (Signed)

## 2022-11-02 NOTE — Progress Notes (Signed)
[name]   HEMATOLOGY/ONCOLOGY CLINIC VISIT NOTE  Date of Service: 11/02/22   Patient Care Team: Johney Maine, MD as PCP - General (Hematology)  CHIEF COMPLAINTS/PURPOSE OF CONSULTATION:  Follow-up for continued evaluation and management of polycythemia and hemochromatosis  HISTORY OF PRESENTING ILLNESS:  Cameron Herrera is a wonderful 57 y.o. male who has been referred to [place] by Dr .Candise Che, Corene Cornea, MD  for evaluation and management of polycythemia  Patient last had labs with his primary care physician on 05/11/2021 which showed hemoglobin of 17.4 with a hematocrit of 51.7 with a WBC count of 7.1k and platelets of 241k.  Review of labs showed that he has had elevated hemoglobin and hematocrit levels since at least September 2021.  Patient notes no chest pain or shortness of breath.  No new leg pain or swelling. No change in vision. No headaches or new focal neurological deficits.  Off smokeless tobacco No smoked tobacco since 2018 No VTE No history of MI CVA 04/2021  ETOH -  2-10 drinks /day vodka/whisky-- cutting down.  HCTZ No sleep apnea.  No testosterone supplementation.  INTERVAL HISTORY   Cameron Herrera is here for continued evaluation and management of his polycythemia vera.  Patient was last seen by me on 05/03/2022 and he was doing well overall.   Patient notes he has been doing well overall without any new or severe medical concerns since our last visit. He has had 3 phlebotomies since our last visit. Patient notes he has tolerated all of the 3 phlebotomies well without any new or severe toxicities. He does report of low blood pressure before his last phlebotomy. Patient notes his usual blood pressure at home is slightly high, but he regularly takes blood pressure medication.   Patient notes he stays well hydrated throughout the day. He drinks around 1 pint of whisky every week.  He denies any new infection issues, fever, chills, night sweats, abdominal pain,  chest pain, back pain, or leg swelling. He does complain of left hand numbness.      Past Medical History:  Diagnosis Date   HTN (hypertension)    Hyperlipidemia    Neuromuscular disorder (HCC)    numbness hand neck foot left side since stroke 04-2021   Stroke (HCC) 04/2021   took Plavix x 21 days post stroke, now ASA only  CVA 2023 - [facility]  ETOH excess B12 deficiency  SURGICAL HISTORY: -Broken nose -tonsillectomy 2007  SOCIAL HISTORY: Social History   Socioeconomic History   Marital status: Divorced    Spouse name: Not on file   Number of children: Not on file   Years of education: Not on file   Highest education level: Not on file  Occupational History   Not on file  Tobacco Use   Smoking status: Former    Types: Cigarettes    Quit date: 2018    Years since quitting: 6.5   Smokeless tobacco: Current    Types: Chew   Tobacco comments:    Herbal packs per pt-  random use   Substance and Sexual Activity   Alcohol use: Yes    Comment: 3-4 beers per day + Vodka   Drug use: Yes    Types: Benzodiazepines   Sexual activity: Not on file  Other Topics Concern   Not on file  Social History Narrative   Not on file   Social Determinants of Health   Financial Resource Strain: Not on file  Food Insecurity: Not on file  Transportation Needs: Not on file  Physical Activity: Not on file  Stress: Not on file  Social Connections: Not on file  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  MEDICATIONS:  Current  Outpatient Medications  Medication Sig Dispense Refill   aspirin EC 81 MG EC tablet Take 1 tablet (81 mg total) by mouth daily. Swallow whole. 30 tablet 1   atorvastatin (LIPITOR) 40 MG tablet Take 1 tablet (40 mg total) by mouth daily. 90 tablet 3   Multiple Vitamin (MULTIVITAMIN) tablet Take 1 tablet by mouth daily.     telmisartan-hydrochlorothiazide (MICARDIS HCT) 80-25 MG tablet TAKE 1 TABLET BY MOUTH EVERY DAY 90 tablet 1   thiamine 100 MG tablet Take 1 tablet (100 mg total) by mouth daily. (Patient not taking: Reported on 09/25/2021) 30 tablet 1   vitamin B-12 (CYANOCOBALAMIN) 1000 MCG tablet Take 1 tablet (1,000 mcg total) by mouth daily. 30 tablet 1   No current facility-administered medications for this visit.    REVIEW OF SYSTEMS:    10 Point review of Systems was done is negative except as noted above.  PHYSICAL EXAMINATION: .BP (!) 144/99   Pulse 85   Temp 98.1 F (36.7 C)   Resp 20   Wt 215 lb 3.2 oz (97.6 kg)   SpO2 98%   BMI 30.01 kg/m  . GENERAL:alert, in no acute distress and comfortable SKIN: no acute rashes, no significant lesions EYES: conjunctiva are pink and non-injected, sclera anicteric OROPHARYNX: MMM, no exudates, no oropharyngeal erythema or ulceration NECK: supple, no JVD LYMPH:  no palpable lymphadenopathy in the cervical, axillary or inguinal regions LUNGS: clear to auscultation b/l with normal respiratory effort HEART: regular rate & rhythm ABDOMEN:  normoactive bowel sounds , non tender, not distended. Extremity: no pedal edema PSYCH: alert & oriented x 3 with fluent speech NEURO: no focal motor/sensory deficits   LABORATORY DATA:  I have reviewed the data as listed  .    Latest Ref Rng & Units 11/02/2022    9:12 AM 09/06/2022    7:56 AM 07/12/2022    7:46 AM  CBC  WBC 4.0 - 10.5 K/uL 6.3  4.6  6.0   Hemoglobin 13.0 - 17.0 g/dL 69.6  29.5  28.4   Hematocrit 39.0 - 52.0 % 44.1  41.3  43.8   Platelets 150 - 400 K/uL 254  240  231      .    Latest Ref Rng & Units 11/02/2022    9:12 AM 09/06/2022    7:56 AM 07/12/2022    7:46 AM  CMP  Glucose 70 - 99 mg/dL 96  132  440   BUN 6 - 20 mg/dL 16  15  14    Creatinine 0.61 - 1.24 mg/dL 1.02  7.25  3.66   Sodium 135 - 145 mmol/L 136  139  135  Potassium 3.5 - 5.1 mmol/L 3.7  3.6  3.6   Chloride 98 - 111 mmol/L 99  103  99   CO2 22 - 32 mmol/L 30  25  28    Calcium 8.9 - 10.3 mg/dL 9.3  8.8  8.9   Total Protein 6.5 - 8.1 g/dL 7.3  6.8  7.2   Total Bilirubin 0.3 - 1.2 mg/dL 1.1  0.6  0.9   Alkaline Phos 38 - 126 U/L 69  65  74   AST 15 - 41 U/L 23  24  23    ALT 0 - 44 U/L 32  31  33    . Lab Results  Component Value Date   IRON 99 09/06/2022   TIBC 311 09/06/2022   IRONPCTSAT 32 09/06/2022   (Iron and TIBC)  Lab Results  Component Value Date   FERRITIN 695 (H) 11/02/2022   Component     Latest Ref Rng & Units 06/02/2021  Erythropoietin     2.6 - 18.5 mIU/mL 7.8     -DNA/PCR done 07/09/2021 revealed "c.187C>G (p.His63Asp) - Detected, heterozygous" Hereditary Hemochromatosis confirmed.  RADIOGRAPHIC STUDIES: I have personally reviewed the radiological images as listed and agreed with the findings in the report.  ASSESSMENT & PLAN:   57 year old male with  1) Polycythemia -likely secondary to hemoconcentration from heavy alcohol use, decreased p.o. fluid intake and diuretics. Resolved on labs today. Unlikely polycythemia vera with negative clonal erythrocytosis mutation testing.  Patient's JAK2 mutation testing is negative which makes it less than 1% likely to be polycythemia vera.  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 Lab Results  Component Value Date   IRON 151 11/02/2022   TIBC 333 11/02/2022   IRONPCTSAT 45 (H) 11/02/2022   (Iron and TIBC)  Lab Results  Component Value Date   FERRITIN 695 (H) 11/02/2022   PLAN: -Discussed lab results from today, 11/02/2022, with the  patient. CBC is stable.  CMP is stable, no abnormal LFts - ferritin still elevated at  695 with iron saturation of 45% -Discussed Ferritin level results from 09/06/2022, which showed ferritin of 657.  -Counseled on minimizing and potentially abstaining from alcohol use. -Avoid any iron supplementation.  -Continue phlebotomy during this visit.   FOLLOW-UP: Therapeutic phlebotomy every 2 months x 4 starting soon as possible return to clinic with Dr. Candise Che in 6 months  The total time spent in the appointment was 21 minutes* .  All of the patient's questions were answered with apparent satisfaction. The patient knows to call the clinic with any problems, questions or concerns.   Wyvonnia Lora MD MS AAHIVMS [facility] Hematology/Oncology Physician [facility]  .*Total Encounter Time as defined by the Centers for Medicare and Medicaid Services includes, in addition to the face-to-face time of a patient visit (documented in the note above) non-face-to-face time: obtaining and reviewing outside history, ordering and reviewing medications, tests or procedures, care coordination (communications with other health care professionals or caregivers) and documentation in the medical record.   I,Param Shah,acting as a Neurosurgeon for Wyvonnia Lora, MD.,have documented all relevant documentation on the behalf of Wyvonnia Lora, MD,as directed by  Wyvonnia Lora, MD while in the presence of Wyvonnia Lora, MD.

## 2022-11-08 ENCOUNTER — Telehealth: Payer: Self-pay

## 2022-11-08 ENCOUNTER — Encounter: Payer: Self-pay | Admitting: Hematology

## 2022-11-08 ENCOUNTER — Telehealth: Payer: Self-pay | Admitting: Hematology

## 2022-11-08 NOTE — Telephone Encounter (Signed)
Contacted pt per Dr Candise Che: to let the patient know that his ferritin and iron saturation still remain elevated and we will have to continue therapeutic phlebotomy every 2 months.  His needs for therapeutic phlebotomies will significantly be low or if he were to abstain from alcohol.  I will have scheduling call to schedule- Therapeutic phlebotomy every 2 months x 4  return to clinic with Dr. Candise Che in 6 months  Pt acknowledged information and verbalized understanding.

## 2022-11-08 NOTE — Telephone Encounter (Signed)
Called patient twice; left a message regarding next upcoming appointment times/dates

## 2022-12-15 ENCOUNTER — Other Ambulatory Visit: Payer: Self-pay | Admitting: Family Medicine

## 2022-12-15 DIAGNOSIS — I639 Cerebral infarction, unspecified: Secondary | ICD-10-CM

## 2022-12-15 DIAGNOSIS — I10 Essential (primary) hypertension: Secondary | ICD-10-CM

## 2022-12-31 ENCOUNTER — Other Ambulatory Visit: Payer: Self-pay

## 2023-01-03 ENCOUNTER — Inpatient Hospital Stay: Payer: 59

## 2023-01-03 ENCOUNTER — Inpatient Hospital Stay: Payer: 59 | Attending: Hematology

## 2023-01-03 DIAGNOSIS — D45 Polycythemia vera: Secondary | ICD-10-CM | POA: Insufficient documentation

## 2023-01-03 LAB — CBC WITH DIFFERENTIAL (CANCER CENTER ONLY)
Abs Immature Granulocytes: 0.04 10*3/uL (ref 0.00–0.07)
Basophils Absolute: 0 10*3/uL (ref 0.0–0.1)
Basophils Relative: 1 %
Eosinophils Absolute: 0.1 10*3/uL (ref 0.0–0.5)
Eosinophils Relative: 2 %
HCT: 43.9 % (ref 39.0–52.0)
Hemoglobin: 15.6 g/dL (ref 13.0–17.0)
Immature Granulocytes: 1 %
Lymphocytes Relative: 31 %
Lymphs Abs: 1.5 10*3/uL (ref 0.7–4.0)
MCH: 32 pg (ref 26.0–34.0)
MCHC: 35.5 g/dL (ref 30.0–36.0)
MCV: 90 fL (ref 80.0–100.0)
Monocytes Absolute: 0.5 10*3/uL (ref 0.1–1.0)
Monocytes Relative: 11 %
Neutro Abs: 2.6 10*3/uL (ref 1.7–7.7)
Neutrophils Relative %: 54 %
Platelet Count: 239 10*3/uL (ref 150–400)
RBC: 4.88 MIL/uL (ref 4.22–5.81)
RDW: 12.5 % (ref 11.5–15.5)
WBC Count: 4.8 10*3/uL (ref 4.0–10.5)
nRBC: 0 % (ref 0.0–0.2)

## 2023-01-03 LAB — CMP (CANCER CENTER ONLY)
ALT: 24 U/L (ref 0–44)
AST: 18 U/L (ref 15–41)
Albumin: 4.2 g/dL (ref 3.5–5.0)
Alkaline Phosphatase: 58 U/L (ref 38–126)
Anion gap: 7 (ref 5–15)
BUN: 13 mg/dL (ref 6–20)
CO2: 27 mmol/L (ref 22–32)
Calcium: 9.2 mg/dL (ref 8.9–10.3)
Chloride: 104 mmol/L (ref 98–111)
Creatinine: 0.88 mg/dL (ref 0.61–1.24)
GFR, Estimated: >60 mL/min (ref >60)
Glucose, Bld: 131 mg/dL — ABNORMAL HIGH (ref 70–99)
Potassium: 3.9 mmol/L (ref 3.5–5.1)
Sodium: 138 mmol/L (ref 135–145)
Total Bilirubin: 0.8 mg/dL (ref 0.3–1.2)
Total Protein: 7 g/dL (ref 6.5–8.1)

## 2023-01-03 LAB — IRON AND IRON BINDING CAPACITY (CC-WL,HP ONLY)
Iron: 140 ug/dL (ref 45–182)
Saturation Ratios: 41 % — ABNORMAL HIGH (ref 17.9–39.5)
TIBC: 342 ug/dL (ref 250–450)
UIBC: 202 ug/dL (ref 117–376)

## 2023-01-03 LAB — FERRITIN: Ferritin: 368 ng/mL — ABNORMAL HIGH (ref 24–336)

## 2023-01-03 NOTE — Progress Notes (Signed)
Patient here for his therapeutic phlebotomy- his hemoglobin is 15.6 and his last ferritin was 695. Per Dr. Candise Che- goal is to get his ferritin closer to 100. Ok to do phlebotomy today. VSS- BP (!) 145/100 (BP Location: Right Arm, Patient Position: Sitting)   Pulse 91   Temp 98.8 F (37.1 C) (Oral)   Resp 16   SpO2 98%   16g needle was uses in the [facility] with no issues- phlebotomy was started at 0828 and completed at 0834 with 506g out. Patient offered fluids and snacks. 30 minute observation complete. VSS-  BP (!) 135/97 (BP Location: Left Arm, Patient Position: Sitting)   Pulse 94   Temp 98.6 F (37 C) (Oral)   Resp 16   SpO2 99%   Ambulatory to the lobby.

## 2023-01-03 NOTE — Patient Instructions (Signed)

## 2023-02-08 ENCOUNTER — Other Ambulatory Visit: Payer: Self-pay | Admitting: Family Medicine

## 2023-02-08 DIAGNOSIS — I10 Essential (primary) hypertension: Secondary | ICD-10-CM

## 2023-02-08 DIAGNOSIS — I639 Cerebral infarction, unspecified: Secondary | ICD-10-CM

## 2023-02-24 ENCOUNTER — Other Ambulatory Visit: Payer: Self-pay

## 2023-02-28 ENCOUNTER — Inpatient Hospital Stay: Payer: 59 | Attending: Hematology

## 2023-02-28 ENCOUNTER — Inpatient Hospital Stay: Payer: 59

## 2023-02-28 ENCOUNTER — Other Ambulatory Visit: Payer: Self-pay

## 2023-02-28 DIAGNOSIS — D45 Polycythemia vera: Secondary | ICD-10-CM | POA: Insufficient documentation

## 2023-02-28 LAB — CBC WITH DIFFERENTIAL (CANCER CENTER ONLY)
Abs Immature Granulocytes: 0.02 10*3/uL (ref 0.00–0.07)
Basophils Absolute: 0 10*3/uL (ref 0.0–0.1)
Basophils Relative: 1 %
Eosinophils Absolute: 0.1 10*3/uL (ref 0.0–0.5)
Eosinophils Relative: 1 %
HCT: 47.1 % (ref 39.0–52.0)
Hemoglobin: 17 g/dL (ref 13.0–17.0)
Immature Granulocytes: 0 %
Lymphocytes Relative: 25 %
Lymphs Abs: 1.3 10*3/uL (ref 0.7–4.0)
MCH: 32.9 pg (ref 26.0–34.0)
MCHC: 36.1 g/dL — ABNORMAL HIGH (ref 30.0–36.0)
MCV: 91.1 fL (ref 80.0–100.0)
Monocytes Absolute: 0.5 10*3/uL (ref 0.1–1.0)
Monocytes Relative: 9 %
Neutro Abs: 3.5 10*3/uL (ref 1.7–7.7)
Neutrophils Relative %: 64 %
Platelet Count: 227 10*3/uL (ref 150–400)
RBC: 5.17 MIL/uL (ref 4.22–5.81)
RDW: 12.4 % (ref 11.5–15.5)
WBC Count: 5.3 10*3/uL (ref 4.0–10.5)
nRBC: 0 % (ref 0.0–0.2)

## 2023-02-28 LAB — CMP (CANCER CENTER ONLY)
ALT: 32 U/L (ref 0–44)
AST: 19 U/L (ref 15–41)
Albumin: 4.3 g/dL (ref 3.5–5.0)
Alkaline Phosphatase: 77 U/L (ref 38–126)
Anion gap: 8 (ref 5–15)
BUN: 17 mg/dL (ref 6–20)
CO2: 26 mmol/L (ref 22–32)
Calcium: 9.4 mg/dL (ref 8.9–10.3)
Chloride: 102 mmol/L (ref 98–111)
Creatinine: 1.07 mg/dL (ref 0.61–1.24)
GFR, Estimated: >60 mL/min (ref >60)
Glucose, Bld: 174 mg/dL — ABNORMAL HIGH (ref 70–99)
Potassium: 3.8 mmol/L (ref 3.5–5.1)
Sodium: 136 mmol/L (ref 135–145)
Total Bilirubin: 0.8 mg/dL (ref <1.2)
Total Protein: 7 g/dL (ref 6.5–8.1)

## 2023-02-28 LAB — FERRITIN: Ferritin: 270 ng/mL (ref 24–336)

## 2023-02-28 LAB — IRON AND IRON BINDING CAPACITY (CC-WL,HP ONLY)
Iron: 95 ug/dL (ref 45–182)
Saturation Ratios: 26 % (ref 17.9–39.5)
TIBC: 367 ug/dL (ref 250–450)
UIBC: 272 ug/dL (ref 117–376)

## 2023-02-28 NOTE — Patient Instructions (Signed)

## 2023-02-28 NOTE — Progress Notes (Signed)
Cameron Herrera presents today for phlebotomy per MD orders. Phlebotomy procedure started at 0830 and ended at 58. 512 grams removed via 16G RAC Patient observed for 30 minutes after procedure without any incident. Patient tolerated procedure well. IV needle removed intact.

## 2023-04-14 ENCOUNTER — Other Ambulatory Visit: Payer: Self-pay | Admitting: Family Medicine

## 2023-04-14 DIAGNOSIS — R739 Hyperglycemia, unspecified: Secondary | ICD-10-CM

## 2023-04-14 DIAGNOSIS — I10 Essential (primary) hypertension: Secondary | ICD-10-CM

## 2023-04-14 DIAGNOSIS — I639 Cerebral infarction, unspecified: Secondary | ICD-10-CM

## 2023-04-14 NOTE — Telephone Encounter (Signed)
Copied from CRM [id number]. Topic: Clinical - Medication Refill >> Apr 14, 2023  3:03 PM Elizebeth Brooking wrote: Most Recent Primary Care Visit:  Provider: [facility]  Department: [facility]  Visit Type: LAB  Date: 10/29/2021  Medication: ***  Has the patient contacted their pharmacy?  (Agent: If no, request that the patient contact the pharmacy for the refill. If patient does not wish to contact the pharmacy document the reason why and proceed with request.) (Agent: If yes, when and what did the pharmacy advise?)  Is this the correct pharmacy for this prescription?  If no, delete pharmacy and type the correct one.  This is the patient's preferred pharmacy:  [facility] [address]  - [address] [address] Phone: [phone] Fax: [phone]   Has the prescription been filled recently?   Is the patient out of the medication?   Has the patient been seen for an appointment in the last year OR does the patient have an upcoming appointment?   Can we respond through MyChart?   Agent: Please be advised that Rx refills may take up to 3 business days. We ask that you follow-up with your pharmacy.

## 2023-04-16 ENCOUNTER — Encounter: Payer: Self-pay | Admitting: Family Medicine

## 2023-04-16 MED ORDER — TELMISARTAN-HCTZ 80-25 MG PO TABS: 1.0000 | ORAL_TABLET | Freq: Every day | ORAL | 0 refills | Status: DC

## 2023-04-16 NOTE — Telephone Encounter (Signed)
I have not seen this patient.  I do see that he previously was followed by Janeece Agee, with last visit in May 2023.  He has recent lab work from November 4.  Hyperglycemia with glucose 174 at that time, with normal creatinine.  Blood pressure 137/87 at his infusion treatment November 4.  He will need to establish with new primary care provider and unfortunately I am closed to new patients at this time.  I do recommend a lab only visit for hemoglobin A1c, and depending on that result may need office visit prior to establishing with new PCP.  I will send in a temporary 47-month supply for his blood pressure meds -so that he can establish with our new physician in March or other primary care provider locally.  Thanks.  Please schedule lab visit.

## 2023-04-18 ENCOUNTER — Encounter: Payer: 59 | Admitting: Family Medicine

## 2023-04-29 ENCOUNTER — Telehealth: Payer: Self-pay | Admitting: Hematology

## 2023-04-29 NOTE — Telephone Encounter (Signed)
 Spoke with patient confirming upcoming appointment

## 2023-05-02 ENCOUNTER — Inpatient Hospital Stay: Payer: 59 | Admitting: Hematology

## 2023-05-02 ENCOUNTER — Inpatient Hospital Stay: Payer: 59

## 2023-05-09 ENCOUNTER — Other Ambulatory Visit: Payer: Self-pay

## 2023-05-10 ENCOUNTER — Inpatient Hospital Stay: Payer: 59

## 2023-05-10 ENCOUNTER — Inpatient Hospital Stay: Payer: 59 | Admitting: Hematology

## 2023-05-10 ENCOUNTER — Inpatient Hospital Stay: Payer: 59 | Attending: Hematology

## 2023-05-10 DIAGNOSIS — R7989 Other specified abnormal findings of blood chemistry: Secondary | ICD-10-CM | POA: Diagnosis not present

## 2023-05-10 DIAGNOSIS — E785 Hyperlipidemia, unspecified: Secondary | ICD-10-CM | POA: Diagnosis not present

## 2023-05-10 DIAGNOSIS — D45 Polycythemia vera: Secondary | ICD-10-CM | POA: Diagnosis present

## 2023-05-10 DIAGNOSIS — Z8673 Personal history of transient ischemic attack (TIA), and cerebral infarction without residual deficits: Secondary | ICD-10-CM | POA: Diagnosis not present

## 2023-05-10 LAB — CMP (CANCER CENTER ONLY)
ALT: 24 U/L (ref 0–44)
AST: 19 U/L (ref 15–41)
Albumin: 4.5 g/dL (ref 3.5–5.0)
Alkaline Phosphatase: 68 U/L (ref 38–126)
Anion gap: 7 (ref 5–15)
BUN: 15 mg/dL (ref 6–20)
CO2: 32 mmol/L (ref 22–32)
Calcium: 9.8 mg/dL (ref 8.9–10.3)
Chloride: 97 mmol/L — ABNORMAL LOW (ref 98–111)
Creatinine: 0.88 mg/dL (ref 0.61–1.24)
GFR, Estimated: >60 mL/min (ref >60)
Glucose, Bld: 127 mg/dL — ABNORMAL HIGH (ref 70–99)
Potassium: 3.6 mmol/L (ref 3.5–5.1)
Sodium: 136 mmol/L (ref 135–145)
Total Bilirubin: 1.1 mg/dL (ref 0.0–1.2)
Total Protein: 7.5 g/dL (ref 6.5–8.1)

## 2023-05-10 LAB — CBC WITH DIFFERENTIAL (CANCER CENTER ONLY)
Abs Immature Granulocytes: 0.03 10*3/uL (ref 0.00–0.07)
Basophils Absolute: 0 10*3/uL (ref 0.0–0.1)
Basophils Relative: 1 %
Eosinophils Absolute: 0.1 10*3/uL (ref 0.0–0.5)
Eosinophils Relative: 2 %
HCT: 46.8 % (ref 39.0–52.0)
Hemoglobin: 16.7 g/dL (ref 13.0–17.0)
Immature Granulocytes: 1 %
Lymphocytes Relative: 29 %
Lymphs Abs: 1.6 10*3/uL (ref 0.7–4.0)
MCH: 32 pg (ref 26.0–34.0)
MCHC: 35.7 g/dL (ref 30.0–36.0)
MCV: 89.7 fL (ref 80.0–100.0)
Monocytes Absolute: 0.6 10*3/uL (ref 0.1–1.0)
Monocytes Relative: 11 %
Neutro Abs: 3.3 10*3/uL (ref 1.7–7.7)
Neutrophils Relative %: 56 %
Platelet Count: 224 10*3/uL (ref 150–400)
RBC: 5.22 MIL/uL (ref 4.22–5.81)
RDW: 11.9 % (ref 11.5–15.5)
WBC Count: 5.7 10*3/uL (ref 4.0–10.5)
nRBC: 0 % (ref 0.0–0.2)

## 2023-05-10 LAB — IRON AND IRON BINDING CAPACITY (CC-WL,HP ONLY)
Iron: 113 ug/dL (ref 45–182)
Saturation Ratios: 30 % (ref 17.9–39.5)
TIBC: 384 ug/dL (ref 250–450)
UIBC: 271 ug/dL (ref 117–376)

## 2023-05-10 LAB — FERRITIN: Ferritin: 414 ng/mL — ABNORMAL HIGH (ref 24–336)

## 2023-05-10 NOTE — Progress Notes (Signed)
 Patient seen by Dr. Onesimo Moccasin are within treatment parameters.  Labs reviewed: and are within treatment parameters. Ferritin pending  Per physician team, patient is ready for treatment and there are NO modifications to the treatment plan.  Ok for phlebotomy today do not need to wait for Ferritin

## 2023-05-10 NOTE — Progress Notes (Signed)
 [facility]   HEMATOLOGY/ONCOLOGY CLINIC VISIT NOTE  Date of Service: 05/10/23   Patient Care Team: Onesimo Emaline Brink, MD as PCP - General (Hematology)  CHIEF COMPLAINTS/PURPOSE OF CONSULTATION:  Follow-up for continued evaluation and management of polycythemia and hemochromatosis  HISTORY OF PRESENTING ILLNESS:  Cameron Herrera is a wonderful 58 y.o. male who has been referred to us  by Dr .Onesimo, Emaline Brink, MD  for evaluation and management of polycythemia  Patient last had labs with his primary care physician on 05/11/2021 which showed hemoglobin of 17.4 with a hematocrit of 51.7 with a WBC count of 7.1k and platelets of 241k.  Review of labs showed that he has had elevated hemoglobin and hematocrit levels since at least September 2021.  Patient notes no chest pain or shortness of breath.  No new leg pain or swelling. No change in vision. No headaches or new focal neurological deficits.  Off smokeless tobacco No smoked tobacco since 2018 No VTE No history of MI CVA 04/2021  ETOH -  2-10 drinks /day vodka/whisky-- cutting down.  HCTZ No sleep apnea.  No testosterone supplementation.  INTERVAL HISTORY   Cameron Herrera is here for continued evaluation and management of his polycythemia vera.  Patient was last seen by me on 11/02/2022 and he complained of left hand numbness, but was doing well overall.   Patient notes he has been doing well overall since our last visit. He denies any new infection issues, fever, chills, night sweats, unexpected weight loss, back pain, chest pain, or leg swelling. He does complain of cough, but denies fever or other respiratory symptoms.   He drinks around 5-6 alcoholic drinks a week. However, he has minimized his alcoholic intake.   Patient notes that his PCP retired and he has new PCP appointment tomorrow.   Patient currently takes Lipitor 40 mg.    Past Medical History:  Diagnosis Date   HTN (hypertension)    Hyperlipidemia    Neuromuscular  disorder (HCC)    numbness hand neck foot left side since stroke 04-2021   Stroke (HCC) 04/2021   took Plavix  x 21 days post stroke, now ASA only  CVA 2023 - [facility]  ETOH excess B12 deficiency  SURGICAL HISTORY: -Broken nose -tonsillectomy 2007  SOCIAL HISTORY: Social History   Socioeconomic History   Marital status: Divorced    Spouse name: Not on file   Number of children: Not on file   Years of education: Not on file   Highest education level: Associate degree: occupational, scientist, product/process development, or vocational program  Occupational History   Not on file  Tobacco Use   Smoking status: Former    Current packs/day: 0.00    Types: Cigarettes    Quit date: 2018    Years since quitting: 7.0   Smokeless tobacco: Current    Types: Chew   Tobacco comments:    Herbal packs per pt-  random use   Substance and Sexual Activity   Alcohol use: Yes    Comment: 3-4 beers per day + Vodka   Drug use: Yes    Types: Benzodiazepines   Sexual activity: Not on file  Other Topics Concern   Not on file  Social History Narrative   Not on file   Social Drivers of Health   Financial Resource Strain: Low Risk  (04/15/2023)   Overall Financial Resource Strain (CARDIA)    Difficulty of Paying Living Expenses: Not hard at all  Food Insecurity: No Food Insecurity (04/15/2023)  Hunger Vital Sign    Worried About Running Out of Food in the Last Year: Never true    Ran Out of Food in the Last Year: Never true  Transportation Needs: No Transportation Needs (04/15/2023)   PRAPARE - Administrator, Civil Service (Medical): No    Lack of Transportation (Non-Medical): No  Physical Activity: Unknown (04/15/2023)   Exercise Vital Sign    Days of Exercise per Week: 0 days    Minutes of Exercise per Session: Not on file  Stress: No Stress Concern Present (04/15/2023)   [employer] of Occupational Health - Occupational Stress Questionnaire    Feeling of Stress : Not at all  Social  Connections: Unknown (04/15/2023)   Social Connection and Isolation Panel [NHANES]    Frequency of Communication with Friends and Family: More than three times a week    Frequency of Social Gatherings with Friends and Family: Twice a week    Attends Religious Services: Patient declined    Database Administrator or Organizations: No    Attends Engineer, Structural: Not on file    Marital Status: Divorced  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  MEDICATIONS:  Current Outpatient Medications  Medication Sig Dispense Refill   aspirin  EC 81 MG EC tablet Take 1 tablet (81 mg total) by mouth daily. Swallow whole. 30 tablet 1   atorvastatin  (LIPITOR) 40 MG tablet Take 1 tablet (40 mg total) by mouth daily. 90 tablet 3   b complex vitamins capsule Take 1 capsule by mouth daily.     Multiple Vitamin (MULTIVITAMIN) tablet Take 1 tablet by mouth daily.     telmisartan -hydrochlorothiazide (MICARDIS  HCT) 80-25 MG tablet Take 1 tablet by mouth daily. 90 tablet 0   thiamine  100 MG tablet Take 1 tablet (100 mg total) by mouth daily. (Patient not taking: Reported on 09/25/2021) 30 tablet 1   vitamin B-12 (CYANOCOBALAMIN ) 1000 MCG tablet Take 1 tablet (1,000 mcg total) by mouth daily. (Patient not taking: Reported on 11/02/2022) 30 tablet 1   No current facility-administered medications for this visit.    REVIEW OF SYSTEMS:    10 Point review of Systems was done is negative except as noted above.  PHYSICAL EXAMINATION: .BP (!) 146/83  (BP Location: Right Arm, Patient Position: Sitting)   Pulse 81   Temp 97.7 F (36.5 C)   Resp 18   Wt 218 lb 8 oz (99.1 kg)   SpO2 99%   BMI 30.47 kg/m  . GENERAL:alert, in no acute distress and comfortable SKIN: no acute rashes, no significant lesions EYES: conjunctiva are pink and non-injected, sclera anicteric OROPHARYNX: MMM, no exudates, no oropharyngeal erythema or ulceration NECK: supple, no JVD LYMPH:  no palpable lymphadenopathy in the cervical, axillary or inguinal regions LUNGS: clear to auscultation b/l with normal respiratory effort HEART: regular rate &  rhythm ABDOMEN:  normoactive bowel sounds , non tender, not distended. Extremity: no pedal edema PSYCH: alert & oriented x 3 with fluent speech NEURO: no focal motor/sensory deficits   LABORATORY DATA:  I have reviewed the data as listed  .    Latest Ref Rng & Units 05/10/2023    8:18 AM 02/28/2023    8:00 AM 01/03/2023    7:56 AM  CBC  WBC 4.0 - 10.5 K/uL 5.7  5.3  4.8   Hemoglobin 13.0 - 17.0 g/dL 83.2  82.9  84.3   Hematocrit 39.0 - 52.0 % 46.8  47.1  43.9   Platelets 150 - 400 K/uL 224  227  239     .    Latest Ref Rng & Units 02/28/2023    8:00 AM 01/03/2023    7:56 AM 11/02/2022    9:12 AM  CMP  Glucose 70 - 99 mg/dL 825  868  96   BUN 6 - 20 mg/dL 17  13  16    Creatinine 0.61 - 1.24 mg/dL 8.92  9.11  8.99   Sodium 135 - 145 mmol/L 136  138  136   Potassium 3.5 - 5.1 mmol/L 3.8  3.9  3.7   Chloride 98 - 111 mmol/L 102  104  99   CO2 22 - 32 mmol/L 26  27  30    Calcium  8.9 - 10.3 mg/dL 9.4  9.2  9.3   Total Protein 6.5 - 8.1 g/dL 7.0  7.0  7.3   Total Bilirubin <1.2 mg/dL 0.8  0.8  1.1   Alkaline Phos 38 - 126 U/L 77  58  69   AST 15 - 41 U/L 19  18  23    ALT 0 - 44 U/L 32  24  32    . Lab Results  Component Value Date   IRON 113 05/10/2023   TIBC 384 05/10/2023   IRONPCTSAT 30 05/10/2023   (Iron and TIBC)  Lab Results  Component Value Date   FERRITIN 414 (H) 05/10/2023   Component      Latest Ref Rng & Units 06/02/2021  Erythropoietin      2.6 - 18.5 mIU/mL 7.8     -DNA/PCR done 07/09/2021 revealed c.187C>G (p.His63Asp) - Detected, heterozygous Hereditary Hemochromatosis confirmed.  RADIOGRAPHIC STUDIES: I have personally reviewed the radiological images as listed and agreed with the findings in the report.  ASSESSMENT & PLAN:   58 year old male with  1) Polycythemia -likely secondary to hemoconcentration from heavy alcohol use, decreased p.o. fluid intake and diuretics. Resolved on labs today. Unlikely polycythemia vera with negative clonal erythrocytosis mutation testing.  Patient's JAK2 mutation testing is negative which makes it less than 1% likely to be polycythemia vera.  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 Lab Results  Component Value Date   IRON 95 02/28/2023   TIBC 367 02/28/2023   IRONPCTSAT 26 02/28/2023   (Iron and TIBC)  Lab Results  Component Value Date   FERRITIN 270 02/28/2023   . Lab Results  Component Value Date   IRON 113 05/10/2023   TIBC 384 05/10/2023   IRONPCTSAT 30 05/10/2023   (Iron and TIBC)  Lab Results  Component Value Date   FERRITIN 414 (H) 05/10/2023    PLAN: -Discussed lab results from today, 05/10/2023, in detail with the patient. CBC stable. CMP stable. Iron labs pending.  CMP is stable, no abnormal LFts -Counseled on minimizing and potentially abstaining  from alcohol use. -Avoid any iron supplementation.   -if possible try to consume a more vegetables and fruit based diet and less red meats. -Continue to follow-up with PCP regarding hyperlipemia. -Answered all of patient's questions.  -Continue phlebotomy during this visit.  -Therapeutic phlebotomy every 6 months.   FOLLOW-UP: -Please cancel lab and therapeutic phlebotomy appointment in March. -Switching labs and therapeutic phlebotomy appointments to every 6 months. -Return to  clinic with Dr. Onesimo with appointment for therapeutic phlebotomy in 6 months.  Labs 1-2 days prior to this clinic visit  The total time spent in the appointment was 20 minutes* .  All of the patient's questions were answered with apparent satisfaction. The patient knows to call the clinic with any problems, questions or concerns.   Emaline Onesimo MD MS AAHIVMS [facility] Hematology/Oncology Physician [facility]  .*Total Encounter Time as defined by the Centers for Medicare and Medicaid Services includes, in addition to the face-to-face time of a patient visit (documented in the note above) non-face-to-face time: obtaining and reviewing outside history, ordering and reviewing medications, tests or procedures, care coordination (communications with other health care professionals or caregivers) and documentation in the medical record.   I,Param Shah,acting as a neurosurgeon for Emaline Onesimo, MD.,have documented all relevant documentation on the behalf of Emaline Onesimo, MD,as directed by  Emaline Onesimo, MD while in the presence of Emaline Onesimo, MD.  .I have reviewed the above documentation for accuracy and completeness, and I agree with the above. .Marven Veley Kishore Tyrrell Stephens MD

## 2023-05-10 NOTE — Progress Notes (Signed)
 Cameron Herrera presents today for phlebotomy per MD orders. Phlebotomy procedure started at 0943 and ended at [phone]. 16G phlebotomy kit to R AC. 522 grams removed. Patient observed for 30 minutes after procedure without any incident. Food and drink provided. Patient tolerated procedure well.  IV needle removed intact. VSS, ambulatory to lobby upon discharge.

## 2023-05-10 NOTE — Patient Instructions (Signed)

## 2023-05-11 ENCOUNTER — Ambulatory Visit: Payer: 59 | Admitting: Nurse Practitioner

## 2023-05-11 ENCOUNTER — Encounter: Payer: Self-pay | Admitting: Nurse Practitioner

## 2023-05-11 VITALS — BP 136/84 | HR 93 | Temp 97.5°F | Ht 71.0 in | Wt 219.2 lb

## 2023-05-11 DIAGNOSIS — G629 Polyneuropathy, unspecified: Secondary | ICD-10-CM | POA: Insufficient documentation

## 2023-05-11 DIAGNOSIS — Z8673 Personal history of transient ischemic attack (TIA), and cerebral infarction without residual deficits: Secondary | ICD-10-CM

## 2023-05-11 DIAGNOSIS — E782 Mixed hyperlipidemia: Secondary | ICD-10-CM | POA: Diagnosis not present

## 2023-05-11 DIAGNOSIS — R7301 Impaired fasting glucose: Secondary | ICD-10-CM | POA: Diagnosis not present

## 2023-05-11 DIAGNOSIS — I10 Essential (primary) hypertension: Secondary | ICD-10-CM | POA: Diagnosis not present

## 2023-05-11 DIAGNOSIS — Z23 Encounter for immunization: Secondary | ICD-10-CM

## 2023-05-11 DIAGNOSIS — Z125 Encounter for screening for malignant neoplasm of prostate: Secondary | ICD-10-CM | POA: Diagnosis not present

## 2023-05-11 DIAGNOSIS — I639 Cerebral infarction, unspecified: Secondary | ICD-10-CM

## 2023-05-11 MED ORDER — GABAPENTIN 300 MG PO CAPS: 300.0000 mg | ORAL_CAPSULE | Freq: Every day | ORAL | 1 refills | Status: DC

## 2023-05-11 MED ORDER — TELMISARTAN-HCTZ 80-25 MG PO TABS: 1.0000 | ORAL_TABLET | Freq: Every day | ORAL | 3 refills | Status: DC

## 2023-05-11 MED ORDER — CLONIDINE HCL 0.1 MG PO TABS: 0.1000 mg | ORAL_TABLET | Freq: Two times a day (BID) | ORAL | 0 refills | Status: DC

## 2023-05-11 NOTE — Patient Instructions (Signed)
 It was great to see you!  We are checking your labs today and will let you know the results via mychart/phone.   Keep taking your telmisartan /hydrochlorothiazide daily  Start clonidine  1 tablet twice a day for your blood pressure  Start gabapentin  1 capsule at bedtime to see if this helps with the numbness and tingling you can go up to twice a day after 3 days  Let's follow-up in 2 months, sooner if you have concerns.  If a referral was placed today, you will be contacted for an appointment. Please note that routine referrals can sometimes take up to 3-4 weeks to process. Please call our office if you haven't heard anything after this time frame.  Take care,  Rheba Cedar, NP

## 2023-05-11 NOTE — Progress Notes (Signed)
 New Patient Visit  BP 136/84 (BP Location: Left Arm, Patient Position: Sitting, Cuff Size: Normal)   Pulse 93   Temp (!) 97.5 F (36.4 C)   Ht 5\' 11"  (1.803 m)   Wt 219 lb 3.2 oz (99.4 kg)   SpO2 96%   BMI 30.57 kg/m    Subjective:    Patient ID: Cameron Herrera, male    DOB: 07/10/1965, 58 y.o.   MRN: [id number]  CC: Chief Complaint  Patient presents with   Establish Care    NP. Est. Care, concerns with overall health,Rx refill, Flu Vaccine    HPI: Cameron Herrera is a 58 y.o. male presents to transfer care to a new provider.  Introduced to Publishing rights manager role and practice setting.  All questions answered.  Discussed provider/patient relationship and expectations.  Discussed the use of AI scribe software for clinical note transcription with the patient, who gave verbal consent to proceed.  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 The patient reports that these symptoms have been present since his stroke and have not improved significantly over time. The patient also reports frequent urination, but not a large volume each time, a change that occurred after the stroke. The patient has made significant lifestyle changes, including quitting smoking and smokeless tobacco use and reducing alcohol consumption. The patient has also stopped taking Lipitor due to joint and back pain, which has since improved. The patient's cholesterol levels were reportedly very low at the last check, but the patient is interested in rechecking these levels.     Depression and Anxiety Screen done:     05/11/2023    3:09 PM 09/15/2021    3:12 PM 06/12/2021    3:29 PM 05/11/2021    1:01 PM  Depression screen PHQ 2/9  Decreased Interest 0 0 0 0  Down, Depressed, Hopeless 0 0 0 0  PHQ - 2 Score 0 0 0  0  Altered sleeping 0 0 0 0  Tired, decreased energy 0 0 0 0  Change in appetite 0 0 0 0  Feeling bad or failure about yourself  0 0  0  Trouble concentrating 0 0 0 0  Moving slowly or fidgety/restless 0 0 0 0  Suicidal thoughts 0 0 0 0  PHQ-9 Score 0 0 0 0  Difficult doing work/chores  Not difficult at all Not difficult at all Not difficult at all      05/11/2023    3:10 PM  GAD 7 : Generalized Anxiety Score  Nervous, Anxious, on Edge 0  Control/stop worrying 0  Worry too much - different things 0  Trouble relaxing 0  Restless 0  Easily annoyed or irritable 0  Afraid - awful might happen 0  Total GAD 7 Score 0    Past Medical History:  Diagnosis Date   Hemochromatosis    HTN (hypertension)    Hyperlipidemia    Neuromuscular disorder (HCC)    numbness hand neck foot left side since stroke 04-2021   Stroke (HCC) 04/2021   took Plavix  x 21 days post stroke, now ASA only    Past Surgical History:  Procedure Laterality Date   NOSE SURGERY     TONSILLECTOMY      Family History  Problem Relation Age of Onset  Hypertension Mother    Hyperlipidemia Mother    Heart failure Mother    Stroke Mother    COPD Father        smoker   Diabetes Father    Hypertension Father    Hypertension Brother    Alcohol abuse Brother    Cancer Paternal Grandfather    Colon cancer Neg Hx    Colon polyps Neg Hx    Esophageal cancer Neg Hx    Rectal cancer Neg Hx    Stomach cancer Neg Hx      Social History   Tobacco Use   Smoking status: Former    Current packs/day: 0.00    Types: Cigarettes    Quit date: 05/01/2016    Years since quitting: 7.0   Smokeless tobacco: Former    Types: Chew    Quit date: 04/2021   Tobacco comments:    Herbal packs per pt-  random use   Vaping Use   Vaping status: Never Used  Substance Use Topics   Alcohol use: Yes    Alcohol/week: 6.0 standard drinks of alcohol    Types: 6 Cans of beer per week   Drug use: Never    Current Outpatient  Medications on File Prior to Visit  Medication Sig Dispense Refill   aspirin  EC 81 MG EC tablet Take 1 tablet (81 mg total) by mouth daily. Swallow whole. 30 tablet 1   b complex vitamins capsule Take 1 capsule by mouth daily.     Multiple Vitamin (MULTIVITAMIN) tablet Take 1 tablet by mouth daily.     No current facility-administered medications on file prior to visit.     Review of Systems  Constitutional: Negative.   HENT: Negative.    Respiratory:  Positive for cough. Negative for shortness of breath.   Cardiovascular: Negative.   Gastrointestinal: Negative.   Genitourinary:  Positive for frequency. Negative for dysuria.  Musculoskeletal:  Positive for arthralgias.  Skin: Negative.   Neurological:  Positive for numbness (and tingling on his left arm and leg). Negative for dizziness.  Psychiatric/Behavioral: Negative.        Objective:    BP 136/84 (BP Location: Left Arm, Patient Position: Sitting, Cuff Size: Normal)   Pulse 93   Temp (!) 97.5 F (36.4 C)   Ht 5\' 11"  (1.803 m)   Wt 219 lb 3.2 oz (99.4 kg)   SpO2 96%   BMI 30.57 kg/m   Wt Readings from Last 3 Encounters:  05/11/23 219 lb 3.2 oz (99.4 kg)  05/10/23 218 lb 8 oz (99.1 kg)  11/02/22 215 lb 3.2 oz (97.6 kg)    BP Readings from Last 3 Encounters:  05/11/23 136/84  05/10/23 127/77  05/10/23 (!) 146/83    Physical Exam Vitals and nursing note reviewed.  Constitutional:      General: He is not in acute distress.    Appearance: Normal appearance.  HENT:     Head: Normocephalic and atraumatic.     Right Ear: Tympanic membrane, ear canal and external ear normal.     Left Ear: Tympanic membrane, ear canal and external ear normal.  Eyes:     Conjunctiva/sclera: Conjunctivae normal.  Cardiovascular:     Rate and Rhythm: Normal rate and regular rhythm.     Pulses: Normal pulses.     Heart sounds: Normal heart sounds.  Pulmonary:     Effort: Pulmonary effort is normal.     Breath sounds: Normal breath  sounds.  Abdominal:  Palpations: Abdomen is soft.     Tenderness: There is no abdominal tenderness.  Musculoskeletal:        General: Normal range of motion.     Cervical back: Normal range of motion and neck supple. No tenderness.     Right lower leg: No edema.     Left lower leg: No edema.  Lymphadenopathy:     Cervical: No cervical adenopathy.  Skin:    General: Skin is warm and dry.  Neurological:     General: No focal deficit present.     Mental Status: He is alert and oriented to person, place, and time.     Cranial Nerves: No cranial nerve deficit.     Coordination: Coordination normal.     Gait: Gait normal.  Psychiatric:        Mood and Affect: Mood normal.        Behavior: Behavior normal.        Thought Content: Thought content normal.        Judgment: Judgment normal.        Assessment & Plan:   Problem List Items Addressed This Visit       Cardiovascular and Mediastinum   Essential hypertension - Primary   Blood pressure readings are consistently in the 130s-140s while on Telmisartan -hctz 80-25mg  daily. Add Clonidine  0.1mg  twice daily (morning and evening) to the current regimen. Continue home blood pressure monitoring. Check CMP, CBC, lipid panel today.       Relevant Medications   cloNIDine  (CATAPRES ) 0.1 MG tablet   telmisartan -hydrochlorothiazide (MICARDIS  HCT) 80-25 MG tablet   RESOLVED: Acute ischemic stroke (HCC)   Relevant Medications   cloNIDine  (CATAPRES ) 0.1 MG tablet   telmisartan -hydrochlorothiazide (MICARDIS  HCT) 80-25 MG tablet     Nervous and Auditory   Neuropathy   Persistent tingling, numbness, and some motor weakness on the left side are present without current treatment. Start Gabapentin , 300mg  capsule at bedtime, to manage symptoms. Follow-up in 2 months. Check vitamin B 12 today.      Relevant Orders   Vitamin B12     Other   Hereditary hemochromatosis (HCC)   He has been getting therapeutic phlebotomy. Continue  collaboration and recommendations from hematology.       History of CVA (cerebrovascular accident)   Ischemic CVA in 2023. He has continue aspirin  81mg  daily, but stopped lipitor due to side effects. Will check lipid panel today and most likely start rosuvastatin  with CoQ10.       Mixed hyperlipidemia   Lipitor was discontinued due to joint pain, about 3 months ago. Check cholesterol levels today. Consider starting rosuvastatin  with CoQ10, possibly on an every-other-day regimen to minimize side effects.      Relevant Medications   cloNIDine  (CATAPRES ) 0.1 MG tablet   telmisartan -hydrochlorothiazide (MICARDIS  HCT) 80-25 MG tablet   Other Relevant Orders   Lipid panel   Other Visit Diagnoses       Screening PSA (prostate specific antigen)       Screen PSA today   Relevant Orders   PSA     Immunization due       Flu and second shingrix  given today   Relevant Orders   Varicella-zoster vaccine IM (Completed)   Flu vaccine trivalent PF, 6mos and older(Flulaval,Afluria,Fluarix,Fluzone) (Completed)     IFG (impaired fasting glucose)       Check A1c today   Relevant Orders   Hemoglobin A1c       Follow up plan: Return in about  2 months (around 07/09/2023) for CPE.  Cameron Herrera

## 2023-05-11 NOTE — Assessment & Plan Note (Signed)
 He has been getting therapeutic phlebotomy. Continue collaboration and recommendations from hematology.

## 2023-05-11 NOTE — Assessment & Plan Note (Signed)
 Ischemic CVA in 2023. He has continue aspirin  81mg  daily, but stopped lipitor due to side effects. Will check lipid panel today and most likely start rosuvastatin  with CoQ10.

## 2023-05-11 NOTE — Assessment & Plan Note (Signed)
 Persistent tingling, numbness, and some motor weakness on the left side are present without current treatment. Start Gabapentin , 300mg  capsule at bedtime, to manage symptoms. Follow-up in 2 months. Check vitamin B 12 today.

## 2023-05-11 NOTE — Assessment & Plan Note (Signed)
 Blood pressure readings are consistently in the 130s-140s while on Telmisartan -hctz 80-25mg  daily. Add Clonidine  0.1mg  twice daily (morning and evening) to the current regimen. Continue home blood pressure monitoring. Check CMP, CBC, lipid panel today.

## 2023-05-11 NOTE — Assessment & Plan Note (Signed)
 Lipitor was discontinued due to joint pain, about 3 months ago. Check cholesterol levels today. Consider starting rosuvastatin  with CoQ10, possibly on an every-other-day regimen to minimize side effects.

## 2023-05-12 LAB — LIPID PANEL
Cholesterol: 180 mg/dL (ref 0–200)
HDL: 41.8 mg/dL (ref >39.00)
LDL Cholesterol: 110 mg/dL — ABNORMAL HIGH (ref 0–99)
NonHDL: 137.9
Total CHOL/HDL Ratio: 4
Triglycerides: 140 mg/dL (ref 0.0–149.0)
VLDL: 28 mg/dL (ref 0.0–40.0)

## 2023-05-12 LAB — HEMOGLOBIN A1C: Hgb A1c MFr Bld: 6 % (ref 4.6–6.5)

## 2023-05-12 LAB — VITAMIN B12: Vitamin B-12: 640 pg/mL (ref 211–911)

## 2023-05-12 LAB — PSA: PSA: 0.94 ng/mL (ref 0.10–4.00)

## 2023-05-12 MED ORDER — ROSUVASTATIN CALCIUM 20 MG PO TABS: 20.0000 mg | ORAL_TABLET | ORAL | 1 refills | Status: DC

## 2023-05-12 NOTE — Addendum Note (Signed)
Addended by: Rodman Pickle A on: 05/12/2023 02:43 PM   Modules accepted: Orders

## 2023-05-16 ENCOUNTER — Encounter: Payer: Self-pay | Admitting: Hematology

## 2023-06-11 ENCOUNTER — Other Ambulatory Visit: Payer: Self-pay | Admitting: Nurse Practitioner

## 2023-06-15 ENCOUNTER — Encounter: Payer: Self-pay | Admitting: Hematology

## 2023-06-15 ENCOUNTER — Other Ambulatory Visit: Payer: Self-pay | Admitting: Nurse Practitioner

## 2023-06-15 MED ORDER — CLONIDINE HCL 0.1 MG PO TABS: 0.1000 mg | ORAL_TABLET | Freq: Two times a day (BID) | ORAL | 0 refills | Status: DC

## 2023-06-15 NOTE — Telephone Encounter (Signed)
This medication has been prescribed and managed by PCP.

## 2023-06-16 NOTE — Progress Notes (Unsigned)
[facility]ARE [facility] [address] [place] [address] Dept: [phone] Dept Fax: [phone]    Subjective:   Cameron Herrera Dec 08, 1965 06/17/2023  No chief complaint on file.   HPI: Cameron Herrera presents today for re-assessment and management of chronic medical conditions.  Discussed the use of AI scribe software for clinical note transcription with the patient, who gave verbal consent to proceed.  History of Present Illness               The following portions of the patient's history were reviewed and updated as appropriate: past medical history, past surgical history, family history, social history, allergies, medications, and problem list.   Patient Active Problem List   Diagnosis Date Noted   History of CVA (cerebrovascular accident) 05/11/2023   Mixed hyperlipidemia 05/11/2023   Neuropathy 05/11/2023   Hereditary hemochromatosis (HCC) 05/09/2022   Alcohol use 05/03/2021   Essential hypertension 01/10/2020   Past Medical History:  Diagnosis Date   Hemochromatosis    HTN (hypertension)    Hyperlipidemia    Neuromuscular disorder (HCC)    numbness hand neck foot left side since stroke 04-2021   Stroke (HCC) 04/2021   took Plavix x 21 days post stroke, now ASA only   Past Surgical History:  Procedure Laterality Date   NOSE SURGERY     TONSILLECTOMY     Family History  Problem Relation Age of Onset   Hypertension Mother    Hyperlipidemia Mother    Heart failure Mother    Stroke Mother    COPD Father        smoker   Diabetes Father    Hypertension Father    Hypertension Brother    Alcohol abuse Brother    Cancer Paternal Grandfather    Colon cancer Neg Hx    Colon polyps Neg Hx    Esophageal cancer Neg Hx    Rectal cancer Neg Hx    Stomach cancer Neg Hx     Current Outpatient Medications:    aspirin EC 81 MG EC tablet, Take 1 tablet (81 mg total) by mouth daily. Swallow whole., Disp: 30 tablet,  Rfl: 1   b complex vitamins capsule, Take 1 capsule by mouth daily., Disp: , Rfl:    cloNIDine (CATAPRES) 0.1 MG tablet, Take 1 tablet (0.1 mg total) by mouth 2 (two) times daily., Disp: 60 tablet, Rfl: 0   gabapentin (NEURONTIN) 300 MG capsule, Take 1 capsule (300 mg total) by mouth at bedtime., Disp: 30 capsule, Rfl: 1   Multiple Vitamin (MULTIVITAMIN) tablet, Take 1 tablet by mouth daily., Disp: , Rfl:    rosuvastatin (CRESTOR) 20 MG tablet, Take 1 tablet (20 mg total) by mouth 3 (three) times a week., Disp: 12 tablet, Rfl: 1   telmisartan-hydrochlorothiazide (MICARDIS HCT) 80-25 MG tablet, Take 1 tablet by mouth daily., Disp: 90 tablet, Rfl: 3 Allergies  Allergen Reactions   Procardia Xl [Nifedipine Er] Other (See Comments)    Caused ankles to swell     ROS: A complete ROS was performed with pertinent positives/negatives noted in the HPI. The remainder of the ROS are negative.    Objective:   There were no vitals filed for this visit.  GENERAL: Well-appearing, in NAD. Well nourished.  SKIN: Pink, warm and dry. No rash, lesion, ulceration, or ecchymoses.  NECK: Trachea midline. Full ROM w/o pain or tenderness. No lymphadenopathy.  RESPIRATORY: Chest wall symmetrical. Respirations even and non-labored. Breath sounds clear to  auscultation bilaterally.  CARDIAC: S1, S2 present, regular rate and rhythm. Peripheral pulses 2+ bilaterally.  MSK: Muscle tone and strength appropriate for age. Joints w/o tenderness, redness, or swelling.  EXTREMITIES: Without clubbing, cyanosis, or edema.  NEUROLOGIC: No motor or sensory deficits. Steady, even gait.  PSYCH/MENTAL STATUS: Alert, oriented x 3. Cooperative, appropriate mood and affect.   Health Maintenance Due  Topic Date Due   COVID-19 Vaccine (4 - 2024-25 season) 12/26/2022    No results found for any visits on 06/17/23.  The ASCVD Risk score (Arnett DK, et al., 2019) failed to calculate for the following reasons:   Risk score cannot be  calculated because patient has a medical history suggesting prior/existing ASCVD     Assessment & Plan:  Assessment and Plan              There are no diagnoses linked to this encounter. No orders of the defined types were placed in this encounter.  No images are attached to the encounter or orders placed in the encounter. No orders of the defined types were placed in this encounter.   No follow-ups on file.   Salvatore Decent, FNP

## 2023-06-17 ENCOUNTER — Encounter: Payer: Self-pay | Admitting: Internal Medicine

## 2023-06-17 ENCOUNTER — Ambulatory Visit: Payer: 59 | Admitting: Internal Medicine

## 2023-06-17 VITALS — BP 122/84 | HR 77 | Temp 98.6°F | Ht 71.0 in | Wt 214.0 lb

## 2023-06-17 DIAGNOSIS — H539 Unspecified visual disturbance: Secondary | ICD-10-CM

## 2023-06-17 DIAGNOSIS — I10 Essential (primary) hypertension: Secondary | ICD-10-CM | POA: Diagnosis not present

## 2023-06-17 MED ORDER — CLONIDINE HCL 0.1 MG PO TABS: 0.1000 mg | ORAL_TABLET | Freq: Two times a day (BID) | ORAL | 1 refills | Status: DC

## 2023-06-27 ENCOUNTER — Other Ambulatory Visit: Payer: 59

## 2023-07-08 ENCOUNTER — Other Ambulatory Visit: Payer: Self-pay | Admitting: Nurse Practitioner

## 2023-07-11 NOTE — Telephone Encounter (Signed)
 Requesting: GABAPENTIN 300 MG CAPSULE ,  ROSUVASTATIN 20 MG  Last Visit: 05/11/2023 Next Visit: Visit date not found Last Refill: 05/11/23, 05/13/23  Please Advise

## 2023-09-07 ENCOUNTER — Other Ambulatory Visit: Payer: Self-pay | Admitting: Nurse Practitioner

## 2023-09-07 NOTE — Telephone Encounter (Signed)
 Requesting: ROSUVASTATIN  20 MG TAB[*]  Last Visit: 05/11/2023 Next Visit: Visit date not found Last Refill: 07/11/2023  Please Advise

## 2023-09-24 ENCOUNTER — Other Ambulatory Visit: Payer: Self-pay | Admitting: Nurse Practitioner

## 2023-09-26 NOTE — Telephone Encounter (Signed)
 Requesting: ROSUVASTATIN  20 MG TAB[*]  Last Visit: 05/11/2023 Next Visit: Visit date not found Last Refill: 09/07/2023  Please Advise

## 2023-10-21 ENCOUNTER — Other Ambulatory Visit: Payer: Self-pay | Admitting: Nurse Practitioner

## 2023-10-21 DIAGNOSIS — I10 Essential (primary) hypertension: Secondary | ICD-10-CM

## 2023-10-21 DIAGNOSIS — I639 Cerebral infarction, unspecified: Secondary | ICD-10-CM

## 2023-10-21 MED ORDER — TELMISARTAN-HCTZ 80-25 MG PO TABS: 1.0000 | ORAL_TABLET | Freq: Every day | ORAL | 0 refills | Status: DC

## 2023-10-21 NOTE — Telephone Encounter (Unsigned)
 Copied from CRM [id number]. Topic: Clinical - Medication Refill >> Oct 21, 2023  2:31 PM [place] A wrote: Medication: cloNIDine  (CATAPRES ) 0.1 MG tablet; telmisartan -hydrochlorothiazide (MICARDIS  HCT) 80-25 MG tablet  Has the patient contacted their pharmacy? Yes (Agent: If no, request that the patient contact the pharmacy for the refill. If patient does not wish to contact the pharmacy document the reason why and proceed with request.) (Agent: If yes, when and what did the pharmacy advise?)  This is the patient's preferred pharmacy:  [id number] [place] [facility] [place] [place] - [address] [place]. AT [address] [place] [address] Phone: [phone] Fax: [phone]  Is this the correct pharmacy for this prescription? Yes If no, delete pharmacy and type the correct one.   Has the prescription been filled recently? No  Is the patient out of the medication? No  Has the patient been seen for an appointment in the last year OR does the patient have an upcoming appointment? Yes  Can we respond through MyChart? Yes  Agent: Please be advised that Rx refills may take up to 3 business days. We ask that you follow-up with your pharmacy.

## 2023-10-21 NOTE — Telephone Encounter (Signed)
 Requesting: cloNIDine  (CATAPRES ) 0.1 MG tablet ,   telmisartan -hydrochlorothiazide (MICARDIS  HCT) 80-25 MG tablet   Last Visit: 05/11/2023 Next Visit: Visit date not found Last Refill: 06/17/2023, 05/11/2023  Please Advise

## 2023-10-24 MED ORDER — CLONIDINE HCL 0.1 MG PO TABS: 0.1000 mg | ORAL_TABLET | Freq: Two times a day (BID) | ORAL | 1 refills | Status: DC

## 2023-11-04 ENCOUNTER — Other Ambulatory Visit: Payer: Self-pay

## 2023-11-06 NOTE — Progress Notes (Signed)
 [facility]   HEMATOLOGY/ONCOLOGY CLINIC VISIT NOTE  Date of Service: 11/07/2023  Patient Care Team: Nedra Tinnie LABOR, NP as PCP - General (Internal Medicine)  CHIEF COMPLAINTS/PURPOSE OF CONSULTATION:  Follow-up for continued evaluation and management of polycythemia and hemochromatosis  HISTORY OF PRESENTING ILLNESS:  Cameron Herrera is a wonderful 58 y.o. male who has been referred to us  by Dr .Nedra, Tinnie LABOR, NP  for evaluation and management of polycythemia  Patient last had labs with his primary care physician on 05/11/2021 which showed hemoglobin of 17.4 with a hematocrit of 51.7 with a WBC count of 7.1k and platelets of 241k.  Review of labs showed that he has had elevated hemoglobin and hematocrit levels since at least September 2021.  Patient notes no chest pain or shortness of breath.  No new leg pain or swelling. No change in vision. No headaches or new focal neurological deficits.  Off smokeless tobacco No smoked tobacco since 2018 No VTE No history of MI CVA 04/2021  ETOH -  2-10 drinks /day vodka/whisky-- cutting down.  HCTZ No sleep apnea.  No testosterone supplementation.  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  Patient was last seen by me on 05/10/2023 and complained of a cough and was otherwise doing well overall with no acute new symptoms.  Patient notes no acute new symptoms. No bleeding issues. Notes 4-7 drinks of etoh a week.    Past Medical History:  Diagnosis Date   Hemochromatosis    HTN (hypertension)    Hyperlipidemia    Neuromuscular disorder (HCC)    numbness hand neck foot left side since stroke 04-2021   Stroke (HCC) 04/2021   took Plavix  x 21 days post stroke, now ASA only  CVA 2023 - [facility]  ETOH excess B12 deficiency  SURGICAL HISTORY: -Broken nose -tonsillectomy 2007  SOCIAL HISTORY: Social History   Socioeconomic History   Marital status: Divorced    Spouse  name: Not on file   Number of children: 3   Years of education: Not on file   Highest education level: Associate degree: occupational, Scientist, product/process development, or vocational program  Occupational History   Not on file  Tobacco Use   Smoking status: Former    Current packs/day: 0.00    Types: Cigarettes    Quit date: 05/01/2016    Years since quitting: 7.5   Smokeless tobacco: Former    Types: Chew    Quit date: 04/2021   Tobacco comments:    Herbal packs per pt-  random use   Vaping Use   Vaping status: Never Used  Substance and Sexual Activity   Alcohol use: Yes    Alcohol/week: 6.0 standard drinks of alcohol    Types: 6 Cans of beer per week   Drug use: Never   Sexual activity: Not Currently    Birth control/protection: None  Other Topics Concern   Not on file  Social History Narrative   Not on file   Social Drivers of Health   Financial Resource Strain: Low Risk  (04/15/2023)   Overall Financial Resource Strain (CARDIA)    Difficulty of Paying Living Expenses: Not hard at all  Food Insecurity: No Food Insecurity (04/15/2023)   Hunger Vital Sign    Worried About Running Out of Food in the Last Year: Never true    Ran Out of Food in the Last Year: Never true  Transportation Needs: No Transportation Needs (04/15/2023)  PRAPARE - Administrator, Civil Service (Medical): No    Lack of Transportation (Non-Medical): No  Physical Activity: Unknown (04/15/2023)   Exercise Vital Sign    Days of Exercise per Week: 0 days    Minutes of Exercise per Session: Not on file  Stress: No Stress Concern Present (04/15/2023)   [employer] of Occupational Health - Occupational Stress Questionnaire    Feeling of Stress : Not at all  Social Connections: Unknown (04/15/2023)   Social Connection and Isolation Panel    Frequency of Communication with Friends and Family: More than three times a week    Frequency of Social Gatherings with Friends and Family: Twice a week    Attends  Religious Services: Patient declined    Database administrator or Organizations: No    Attends Engineer, structural: Not on file    Marital Status: Divorced  Catering manager Violence: Not on file  Ex firefighter   FAMILY HISTORY: Family History  Problem Relation Age of Onset   Hypertension Mother    Hyperlipidemia Mother    Heart failure Mother    Stroke Mother    COPD Father        smoker   Diabetes Father    Hypertension Father    Hypertension Brother    Alcohol abuse Brother    Cancer Paternal Grandfather    Colon cancer Neg Hx    Colon polyps Neg Hx    Esophageal cancer Neg Hx    Rectal cancer Neg Hx    Stomach cancer Neg Hx    Mother - CAD- died young  ALLERGIES:  is allergic to procardia  xl [nifedipine  er].  MEDICATIONS:  Current Outpatient Medications  Medication Sig Dispense Refill   aspirin  EC 81 MG EC tablet Take 1 tablet (81 mg total) by mouth daily. Swallow whole. 30 tablet 1   b complex vitamins capsule Take 1 capsule by mouth daily.     cloNIDine  (CATAPRES ) 0.1 MG tablet Take 1 tablet (0.1 mg total) by mouth 2 (two) times daily. 180 tablet 1   gabapentin  (NEURONTIN ) 300 MG capsule TAKE ONE CAPSULE BY MOUTH ONE TIME DAILY AT BEDTIME 30 capsule 1   Multiple Vitamin (MULTIVITAMIN) tablet Take 1 tablet by mouth daily.     rosuvastatin  (CRESTOR ) 20 MG tablet TAKE ONE TABLET BY MOUTH THREE TIMES PER WEEK 12 tablet 0   telmisartan -hydrochlorothiazide (MICARDIS  HCT) 80-25 MG tablet Take 1 tablet by mouth daily. 30 tablet 0   No current facility-administered medications for this visit.    REVIEW OF SYSTEMS:    10 Point review of Systems was done is negative except as noted above.  PHYSICAL EXAMINATION: .BP (!) 123/92   Pulse 79   Temp 97.9 F (36.6 C)   Resp 20   Wt 216 lb 8 oz (98.2 kg)   SpO2 97%   BMI 30.20 kg/m  GENERAL:alert, in no acute distress and comfortable SKIN: no acute rashes, no significant lesions EYES: conjunctiva are pink  and non-injected, sclera anicteric OROPHARYNX: MMM, no exudates, no oropharyngeal erythema or ulceration NECK: supple, no JVD LYMPH:  no palpable lymphadenopathy in the cervical, axillary or inguinal regions LUNGS: clear to auscultation b/l with normal respiratory effort HEART: regular rate & rhythm ABDOMEN:  normoactive bowel sounds , non tender, not distended. Extremity: no pedal edema PSYCH: alert & oriented x 3 with fluent speech NEURO: no focal motor/sensory deficits   LABORATORY DATA:  I have reviewed the data  as listed  .    Latest Ref Rng & Units 11/07/2023   12:06 PM 05/10/2023    8:18 AM 02/28/2023    8:00 AM  CBC  WBC 4.0 - 10.5 K/uL 7.2  5.7  5.3   Hemoglobin 13.0 - 17.0 g/dL 84.2  83.2  82.9   Hematocrit 39.0 - 52.0 % 42.7  46.8  47.1   Platelets 150 - 400 K/uL 241  224  227    .    Latest Ref Rng & Units 11/07/2023   12:06 PM 05/10/2023    8:18 AM 02/28/2023    8:00 AM  CMP  Glucose 70 - 99 mg/dL 98  872  825   BUN 6 - 20 mg/dL 13  15  17    Creatinine 0.61 - 1.24 mg/dL 8.97  9.11  8.92   Sodium 135 - 145 mmol/L 136  136  136   Potassium 3.5 - 5.1 mmol/L 3.5  3.6  3.8   Chloride 98 - 111 mmol/L 101  97  102   CO2 22 - 32 mmol/L 26  32  26   Calcium  8.9 - 10.3 mg/dL 9.1  9.8  9.4   Total Protein 6.5 - 8.1 g/dL 7.0  7.5  7.0   Total Bilirubin 0.0 - 1.2 mg/dL 0.9  1.1  0.8   Alkaline Phos 38 - 126 U/L 63  68  77   AST 15 - 41 U/L 15  19  19    ALT 0 - 44 U/L 20  24  32    . Lab Results  Component Value Date   IRON 134 11/07/2023   TIBC 346 11/07/2023   IRONPCTSAT 39 11/07/2023   (Iron and TIBC)  Lab Results  Component Value Date   FERRITIN 440 (H) 11/07/2023   Component     Latest Ref Rng & Units 06/02/2021  Erythropoietin      2.6 - 18.5 mIU/mL 7.8     -DNA/PCR done 07/09/2021 revealed c.187C>G (p.His63Asp) - Detected, heterozygous Hereditary Hemochromatosis confirmed.  RADIOGRAPHIC STUDIES: I have personally reviewed the radiological images as  listed and agreed with the findings in the report.  ASSESSMENT & PLAN:   58 year old male with  1) Polycythemia -likely secondary to hemoconcentration from heavy alcohol use, decreased p.o. fluid intake and diuretics. Resolved on labs today. Unlikely polycythemia vera with negative clonal erythrocytosis mutation testing.  Patient's JAK2 mutation testing is negative which makes it less than 1% likely to be polycythemia vera.  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 Lab Results  Component Value Date   IRON 134 11/07/2023   TIBC 346 11/07/2023   IRONPCTSAT 39 11/07/2023   (Iron and TIBC)  Lab Results  Component Value Date   FERRITIN 440 (H) 11/07/2023   PLAN:  -discussed lab results from today, 11/07/2023, in detail with patient Cbc with resolved secondary polycythemia Cmp - wnl Ferritin 440 with iron saturation of 39% Goal for therapeutic phlebotomy is to keep ferritin<250 Therapeutic phlebotomy today and every 6 months for maintainence  FOLLOW-UP: -Return to clinic with Dr. Onesimo with appointment for therapeutic phlebotomy in 6 months. Labs 1-2 days prior to this clinic visit   The total time spent in the appointment was 21 minutes* .  All of the patient's questions were answered with apparent satisfaction. The patient knows to call the clinic with any problems, questions or concerns.   Emaline Onesimo MD MS AAHIVMS [facility]  Hematology/Oncology Physician [facility]  .*Total Encounter Time as defined by the Centers for Medicare and Medicaid Services includes, in addition to the face-to-face time of a patient visit (documented in the note above) non-face-to-face time: obtaining and reviewing outside history, ordering and reviewing medications, tests or procedures, care coordination (communications with other health care professionals or caregivers) and documentation in the medical record.     I,Mitra Faeizi,acting as a Neurosurgeon for Emaline Saran, MD.,have documented all relevant documentation on the behalf of Emaline Saran, MD,as directed by  Emaline Saran, MD while in the presence of Emaline Saran, MD.  .I have reviewed the above documentation for accuracy and completeness, and I agree with the above. .Jovee Dettinger Kishore Eian Vandervelden MD

## 2023-11-07 ENCOUNTER — Inpatient Hospital Stay: Payer: 59 | Attending: Hematology

## 2023-11-07 ENCOUNTER — Inpatient Hospital Stay: Payer: 59

## 2023-11-07 ENCOUNTER — Inpatient Hospital Stay: Payer: 59 | Admitting: Hematology

## 2023-11-07 DIAGNOSIS — D45 Polycythemia vera: Secondary | ICD-10-CM | POA: Diagnosis present

## 2023-11-07 LAB — CBC WITH DIFFERENTIAL (CANCER CENTER ONLY)
Abs Immature Granulocytes: 0.03 K/uL (ref 0.00–0.07)
Basophils Absolute: 0 K/uL (ref 0.0–0.1)
Basophils Relative: 1 %
Eosinophils Absolute: 0.1 K/uL (ref 0.0–0.5)
Eosinophils Relative: 1 %
HCT: 42.7 % (ref 39.0–52.0)
Hemoglobin: 15.7 g/dL (ref 13.0–17.0)
Immature Granulocytes: 0 %
Lymphocytes Relative: 28 %
Lymphs Abs: 2 K/uL (ref 0.7–4.0)
MCH: 31.7 pg (ref 26.0–34.0)
MCHC: 36.8 g/dL — ABNORMAL HIGH (ref 30.0–36.0)
MCV: 86.1 fL (ref 80.0–100.0)
Monocytes Absolute: 0.6 K/uL (ref 0.1–1.0)
Monocytes Relative: 9 %
Neutro Abs: 4.4 K/uL (ref 1.7–7.7)
Neutrophils Relative %: 61 %
Platelet Count: 241 K/uL (ref 150–400)
RBC: 4.96 MIL/uL (ref 4.22–5.81)
RDW: 12.3 % (ref 11.5–15.5)
WBC Count: 7.2 K/uL (ref 4.0–10.5)
nRBC: 0 % (ref 0.0–0.2)

## 2023-11-07 LAB — CMP (CANCER CENTER ONLY)
ALT: 20 U/L (ref 0–44)
AST: 15 U/L (ref 15–41)
Albumin: 4.3 g/dL (ref 3.5–5.0)
Alkaline Phosphatase: 63 U/L (ref 38–126)
Anion gap: 9 (ref 5–15)
BUN: 13 mg/dL (ref 6–20)
CO2: 26 mmol/L (ref 22–32)
Calcium: 9.1 mg/dL (ref 8.9–10.3)
Chloride: 101 mmol/L (ref 98–111)
Creatinine: 1.02 mg/dL (ref 0.61–1.24)
GFR, Estimated: >60 mL/min (ref >60)
Glucose, Bld: 98 mg/dL (ref 70–99)
Potassium: 3.5 mmol/L (ref 3.5–5.1)
Sodium: 136 mmol/L (ref 135–145)
Total Bilirubin: 0.9 mg/dL (ref 0.0–1.2)
Total Protein: 7 g/dL (ref 6.5–8.1)

## 2023-11-07 LAB — IRON AND IRON BINDING CAPACITY (CC-WL,HP ONLY)
Iron: 134 ug/dL (ref 45–182)
Saturation Ratios: 39 % (ref 17.9–39.5)
TIBC: 346 ug/dL (ref 250–450)
UIBC: 212 ug/dL (ref 117–376)

## 2023-11-07 LAB — FERRITIN: Ferritin: 440 ng/mL — ABNORMAL HIGH (ref 24–336)

## 2023-11-07 NOTE — Patient Instructions (Signed)

## 2023-11-07 NOTE — Progress Notes (Signed)
 Cameron Herrera presents today for phlebotomy per MD orders. Phlebotomy procedure started at 1335 and ended at 1341. 507 grams removed via 16G to RAC. IV needle removed intact. Patient observed for 30 minutes after procedure without any incident. Patient tolerated procedure well.

## 2023-11-14 ENCOUNTER — Encounter: Payer: Self-pay | Admitting: Hematology

## 2023-11-22 ENCOUNTER — Ambulatory Visit: Admitting: Nurse Practitioner

## 2023-11-22 ENCOUNTER — Encounter: Payer: Self-pay | Admitting: Nurse Practitioner

## 2023-11-22 VITALS — BP 118/80 | HR 75 | Temp 97.6°F | Ht 71.0 in | Wt 215.2 lb

## 2023-11-22 DIAGNOSIS — G629 Polyneuropathy, unspecified: Secondary | ICD-10-CM

## 2023-11-22 DIAGNOSIS — I10 Essential (primary) hypertension: Secondary | ICD-10-CM | POA: Diagnosis not present

## 2023-11-22 DIAGNOSIS — E782 Mixed hyperlipidemia: Secondary | ICD-10-CM | POA: Diagnosis not present

## 2023-11-22 MED ORDER — ROSUVASTATIN CALCIUM 20 MG PO TABS: 20.0000 mg | ORAL_TABLET | ORAL | 3 refills | Status: AC

## 2023-11-22 MED ORDER — CLONIDINE HCL 0.1 MG PO TABS: 0.1000 mg | ORAL_TABLET | Freq: Two times a day (BID) | ORAL | 3 refills | Status: AC

## 2023-11-22 MED ORDER — TELMISARTAN-HCTZ 80-25 MG PO TABS: 1.0000 | ORAL_TABLET | Freq: Every day | ORAL | 3 refills | Status: DC

## 2023-11-22 MED ORDER — PREGABALIN 75 MG PO CAPS: 75.0000 mg | ORAL_CAPSULE | Freq: Every day | ORAL | 1 refills | Status: DC

## 2023-11-22 NOTE — Assessment & Plan Note (Signed)
 Chronic, stable. Continue rosuvastatin  20mg  3 times a week. Will check fasting lipid panel next visit in 3 months.

## 2023-11-22 NOTE — Assessment & Plan Note (Signed)
 Chronic left arm and leg paresthesia and heaviness continue post-stroke, with gabapentin  proving ineffective. He is open to alternative treatments. Prescribe Lyrica  75mg  once daily to assess its efficacy compared to gabapentin .

## 2023-11-22 NOTE — Assessment & Plan Note (Signed)
 Chronic, stable. Continue telmisartan -hydrochlorothiazide 80-25mg  daily and clonidine  0.1mg  BID. Recent CMP, CBC reviewed.

## 2023-11-22 NOTE — Patient Instructions (Signed)
 It was great to see you!  Start lyrica  1 capsule daily for the tingling   Let's follow-up in 3 months, sooner if you have concerns.  If a referral was placed today, you will be contacted for an appointment. Please note that routine referrals can sometimes take up to 3-4 weeks to process. Please call our office if you haven't heard anything after this time frame.  Take care,  Tinnie Harada, NP

## 2023-11-22 NOTE — Progress Notes (Signed)
 Established Patient Office Visit  Subjective   Patient ID: Cameron Herrera, male    DOB: May 11, 1965  Age: 58 y.o. MRN: [id number]  Chief Complaint  Patient presents with   Medication Management    With Rx Refills, discuss another option for Gabapentin     HPI  Discussed the use of AI scribe software for clinical note transcription with the patient, who gave verbal consent to proceed.  History of Present Illness   Cameron Herrera is a 58 year old male with a history of stroke who presents for medication management and follow-up.  He experiences changes in thermoregulation, tolerating cold better than heat, and sleeps with the air conditioning on more frequently. He monitors his blood pressure at home, noting it is slightly elevated after work but decreases with medication. He is currently taking clonidine  and telmisartan  but is out of telmisartan  and Crestor . He takes CoQ10 to manage muscle pain associated with Crestor , which he takes three times a week, and reports no current muscle pain.  He experiences persistent tingling in his left arm and occasional neck stiffness, with the arm feeling 'heavy' and similar to when an arm 'goes to sleep.' This sensation is constant, while the neck stiffness is intermittent. Gabapentin  is not helping. This started happening after his stroke in 2023.       ROS See pertinent positives and negatives per HPI.    Objective:     BP 118/80 (BP Location: Right Arm, Patient Position: Sitting, Cuff Size: Normal)   Pulse 75   Temp 97.6 F (36.4 C)   Ht 5' 11 (1.803 m)   Wt 215 lb 3.2 oz (97.6 kg)   SpO2 97%   BMI 30.01 kg/m  BP Readings from Last 3 Encounters:  11/22/23 118/80  11/07/23 (!) 125/90  11/07/23 (!) 123/92   Wt Readings from Last 3 Encounters:  11/22/23 215 lb 3.2 oz (97.6 kg)  11/07/23 216 lb 8 oz (98.2 kg)  06/17/23 214 lb (97.1 kg)      Physical Exam Vitals and nursing note reviewed.  Constitutional:      Appearance: Normal  appearance.  HENT:     Head: Normocephalic.  Eyes:     Conjunctiva/sclera: Conjunctivae normal.  Cardiovascular:     Rate and Rhythm: Normal rate and regular rhythm.     Pulses: Normal pulses.     Heart sounds: Normal heart sounds.  Pulmonary:     Effort: Pulmonary effort is normal.     Breath sounds: Normal breath sounds.  Musculoskeletal:     Cervical back: Normal range of motion.  Skin:    General: Skin is warm.  Neurological:     General: No focal deficit present.     Mental Status: He is alert and oriented to person, place, and time.  Psychiatric:        Mood and Affect: Mood normal.        Behavior: Behavior normal.        Thought Content: Thought content normal.        Judgment: Judgment normal.      Assessment & Plan:   Problem List Items Addressed This Visit       Cardiovascular and Mediastinum   Essential hypertension - Primary   Chronic, stable. Continue telmisartan -hydrochlorothiazide 80-25mg  daily and clonidine  0.1mg  BID. Recent CMP, CBC reviewed.       Relevant Medications   telmisartan -hydrochlorothiazide (MICARDIS  HCT) 80-25 MG tablet   rosuvastatin  (CRESTOR ) 20 MG tablet (Start on 11/23/2023)  cloNIDine  (CATAPRES ) 0.1 MG tablet     Nervous and Auditory   Neuropathy   Chronic left arm and leg paresthesia and heaviness continue post-stroke, with gabapentin  proving ineffective. He is open to alternative treatments. Prescribe Lyrica  75mg  once daily to assess its efficacy compared to gabapentin .         Other   Mixed hyperlipidemia   Chronic, stable. Continue rosuvastatin  20mg  3 times a week. Will check fasting lipid panel next visit in 3 months.       Relevant Medications   telmisartan -hydrochlorothiazide (MICARDIS  HCT) 80-25 MG tablet   rosuvastatin  (CRESTOR ) 20 MG tablet (Start on 11/23/2023)   cloNIDine  (CATAPRES ) 0.1 MG tablet    Return in about 3 months (around 02/22/2024) for CPE.    Tinnie DELENA Harada, NP

## 2024-02-21 ENCOUNTER — Encounter: Admitting: Nurse Practitioner

## 2024-03-27 ENCOUNTER — Ambulatory Visit: Admitting: Nurse Practitioner

## 2024-03-27 ENCOUNTER — Encounter: Payer: Self-pay | Admitting: Nurse Practitioner

## 2024-03-27 VITALS — BP 146/92 | HR 72 | Temp 97.2°F | Ht 71.0 in | Wt 226.8 lb

## 2024-03-27 DIAGNOSIS — Z125 Encounter for screening for malignant neoplasm of prostate: Secondary | ICD-10-CM

## 2024-03-27 DIAGNOSIS — G8929 Other chronic pain: Secondary | ICD-10-CM

## 2024-03-27 DIAGNOSIS — E782 Mixed hyperlipidemia: Secondary | ICD-10-CM | POA: Diagnosis not present

## 2024-03-27 DIAGNOSIS — I10 Essential (primary) hypertension: Secondary | ICD-10-CM | POA: Diagnosis not present

## 2024-03-27 DIAGNOSIS — Z23 Encounter for immunization: Secondary | ICD-10-CM | POA: Diagnosis not present

## 2024-03-27 DIAGNOSIS — G629 Polyneuropathy, unspecified: Secondary | ICD-10-CM

## 2024-03-27 DIAGNOSIS — Z0000 Encounter for general adult medical examination without abnormal findings: Secondary | ICD-10-CM | POA: Diagnosis not present

## 2024-03-27 DIAGNOSIS — Z8673 Personal history of transient ischemic attack (TIA), and cerebral infarction without residual deficits: Secondary | ICD-10-CM

## 2024-03-27 DIAGNOSIS — M545 Low back pain, unspecified: Secondary | ICD-10-CM

## 2024-03-27 LAB — CBC WITH DIFFERENTIAL/PLATELET
Basophils Absolute: 0 K/uL (ref 0.0–0.1)
Basophils Relative: 0.5 % (ref 0.0–3.0)
Eosinophils Absolute: 0.1 K/uL (ref 0.0–0.7)
Eosinophils Relative: 2.5 % (ref 0.0–5.0)
HCT: 44.7 % (ref 39.0–52.0)
Hemoglobin: 15.7 g/dL (ref 13.0–17.0)
Lymphocytes Relative: 22.7 % (ref 12.0–46.0)
Lymphs Abs: 1.3 K/uL (ref 0.7–4.0)
MCHC: 35.1 g/dL (ref 30.0–36.0)
MCV: 92.4 fl (ref 78.0–100.0)
Monocytes Absolute: 0.7 K/uL (ref 0.1–1.0)
Monocytes Relative: 11.3 % (ref 3.0–12.0)
Neutro Abs: 3.6 K/uL (ref 1.4–7.7)
Neutrophils Relative %: 63 % (ref 43.0–77.0)
Platelets: 246 K/uL (ref 150.0–400.0)
RBC: 4.84 Mil/uL (ref 4.22–5.81)
RDW: 12.8 % (ref 11.5–15.5)
WBC: 5.8 K/uL (ref 4.0–10.5)

## 2024-03-27 LAB — LIPID PANEL
Cholesterol: 131 mg/dL (ref 0–200)
HDL: 39.6 mg/dL (ref >39.00)
LDL Cholesterol: 63 mg/dL (ref 0–99)
NonHDL: 91.11
Total CHOL/HDL Ratio: 3
Triglycerides: 139 mg/dL (ref 0.0–149.0)
VLDL: 27.8 mg/dL (ref 0.0–40.0)

## 2024-03-27 LAB — COMPREHENSIVE METABOLIC PANEL WITH GFR
ALT: 24 U/L (ref 0–53)
AST: 17 U/L (ref 0–37)
Albumin: 4.6 g/dL (ref 3.5–5.2)
Alkaline Phosphatase: 62 U/L (ref 39–117)
BUN: 14 mg/dL (ref 6–23)
CO2: 31 meq/L (ref 19–32)
Calcium: 9.4 mg/dL (ref 8.4–10.5)
Chloride: 101 meq/L (ref 96–112)
Creatinine, Ser: 0.94 mg/dL (ref 0.40–1.50)
GFR: 88.23 mL/min (ref >60.00)
Glucose, Bld: 109 mg/dL — ABNORMAL HIGH (ref 70–99)
Potassium: 4.3 meq/L (ref 3.5–5.1)
Sodium: 137 meq/L (ref 135–145)
Total Bilirubin: 1.2 mg/dL (ref 0.2–1.2)
Total Protein: 6.9 g/dL (ref 6.0–8.3)

## 2024-03-27 LAB — FERRITIN: Ferritin: 252.3 ng/mL (ref 22.0–322.0)

## 2024-03-27 LAB — PSA: PSA: 0.65 ng/mL (ref 0.10–4.00)

## 2024-03-27 LAB — IRON: Iron: 152 ug/dL (ref 42–165)

## 2024-03-27 MED ORDER — CYCLOBENZAPRINE HCL 5 MG PO TABS: 5.0000 mg | ORAL_TABLET | Freq: Three times a day (TID) | ORAL | 1 refills | Status: AC | PRN

## 2024-03-27 MED ORDER — TELMISARTAN-HCTZ 80-25 MG PO TABS: 1.0000 | ORAL_TABLET | Freq: Every day | ORAL | 3 refills | Status: AC

## 2024-03-27 NOTE — Assessment & Plan Note (Signed)
 Ischemic CVA in 2023. Continue aspirin  81mg  daily and rosuvastatin  20mg  3 times a week.

## 2024-03-27 NOTE — Assessment & Plan Note (Signed)
 Chronic, ongoing in his left arm and left s/p stroke. Lyrica  did not help either and he would like to hold off on further medication at this point. Follow-up with any concerns.

## 2024-03-27 NOTE — Assessment & Plan Note (Signed)
 Health maintenance reviewed and updated. Discussed nutrition, exercise. Follow-up 1 year.

## 2024-03-27 NOTE — Assessment & Plan Note (Signed)
 Chronic, stable. Discussed lifting with good ergonomics. Start flexeril  5mg  TID prn back tightness/spasm. Discussed this can make him sleepy. Follow-up with any concerns.

## 2024-03-27 NOTE — Patient Instructions (Signed)
 It was great to see you!  We are checking your labs today and will let you know the results via mychart/phone.   I have refilled your blood pressure medication, keep checking your blood pressure at home   Let's follow-up in 6 months, sooner if you have concerns.  If a referral was placed today, you will be contacted for an appointment. Please note that routine referrals can sometimes take up to 3-4 weeks to process. Please call our office if you haven't heard anything after this time frame.  Take care,  Tinnie Harada, NP

## 2024-03-27 NOTE — Assessment & Plan Note (Signed)
 He has been getting therapeutic phlebotomy. Continue collaboration and recommendations from hematology. Check CBC, iron, ferritin today.

## 2024-03-27 NOTE — Assessment & Plan Note (Signed)
 Chronic, ongoing. BP well controlled at home, but has been out of telmisartan -hydrochlorothiazide for a few days. Continue clonidine  0.1mg  BID and telmisartan -hydrochlorothiazide 80-25mg  daily. Check CMP, CBC today. Follow-up in 6 months.

## 2024-03-27 NOTE — Assessment & Plan Note (Signed)
 Chronic, stable. Continue rosuvastatin  20mg  3 times a week. Check CMP, CBC, lipid panel today.

## 2024-03-27 NOTE — Progress Notes (Signed)
 BP (!) 146/92 (BP Location: Right Arm, Cuff Size: Large)   Pulse 72   Temp (!) 97.2 F (36.2 C)   Ht 5' 11 (1.803 m)   Wt 226 lb 12.8 oz (102.9 kg)   SpO2 98%   BMI 31.63 kg/m    Subjective:    Patient ID: Cameron Herrera, male    DOB: 1965-10-30, 58 y.o.   MRN: [id number]  CC: Chief Complaint  Patient presents with   Annual Exam    With fasting labs, no concerns, Rx refill, Flu Vaccine    HPI: Cameron Herrera is a 58 y.o. male presenting on 03/27/2024 for comprehensive medical examination. Current medical complaints include:low back tightness  He states that he has been having some intermittent tightness in his lower back. He was prescribed a muscle relaxer in the past which helped and is wondering if he can get a refill. He states that 1 bottle would last over a year. He does a lot of heavy lifting at work.   He currently lives with: alone   Depression and Anxiety Screen done today and results listed below:     03/27/2024    9:42 AM 11/07/2023    1:00 PM 06/17/2023   10:19 AM 05/11/2023    3:09 PM 09/15/2021    3:12 PM  Depression screen PHQ 2/9  Decreased Interest 0 0 0 0 0  Down, Depressed, Hopeless 0 0 0 0 0  PHQ - 2 Score 0 0 0 0 0  Altered sleeping 0   0 0  Tired, decreased energy 0   0 0  Change in appetite 0   0 0  Feeling bad or failure about yourself  0   0 0  Trouble concentrating 0   0 0  Moving slowly or fidgety/restless 0   0 0  Suicidal thoughts 0   0 0  PHQ-9 Score 0   0  0   Difficult doing work/chores Not difficult at all    Not difficult at all     Data saved with a previous flowsheet row definition      03/27/2024    9:42 AM 05/11/2023    3:10 PM  GAD 7 : Generalized Anxiety Score  Nervous, Anxious, on Edge 0 0  Control/stop worrying 0 0  Worry too much - different things 0 0  Trouble relaxing 0 0  Restless 0 0  Easily annoyed or irritable 0 0  Afraid - awful might happen 0 0  Total GAD 7 Score 0 0  Anxiety Difficulty Not difficult at all      The patient does not have a history of falls. I did not complete a risk assessment for falls. A plan of care for falls was not documented.   Past Medical History:  Past Medical History:  Diagnosis Date   Hemochromatosis    HTN (hypertension)    Hyperlipidemia    Neuromuscular disorder (HCC)    numbness hand neck foot left side since stroke 04-2021   Stroke (HCC) 04/2021   took Plavix  x 21 days post stroke, now ASA only    Surgical History:  Past Surgical History:  Procedure Laterality Date   NOSE SURGERY     TONSILLECTOMY      Medications:  Current Outpatient Medications on File Prior to Visit  Medication Sig   aspirin  EC 81 MG EC tablet Take 1 tablet (81 mg total) by mouth daily. Swallow whole.   b complex vitamins capsule  Take 1 capsule by mouth daily.   cloNIDine  (CATAPRES ) 0.1 MG tablet Take 1 tablet (0.1 mg total) by mouth 2 (two) times daily.   Coenzyme Q10 (CO Q-10 PO) Take by mouth daily.   Multiple Vitamin (MULTIVITAMIN) tablet Take 1 tablet by mouth daily.   rosuvastatin  (CRESTOR ) 20 MG tablet Take 1 tablet (20 mg total) by mouth 3 (three) times a week.   No current facility-administered medications on file prior to visit.    Allergies:  Allergies  Allergen Reactions   Procardia  Xl [Nifedipine  Er] Other (See Comments)    Caused ankles to swell    Social History:  Social History   Socioeconomic History   Marital status: Divorced    Spouse name: Not on file   Number of children: 3   Years of education: Not on file   Highest education level: 12th grade  Occupational History   Not on file  Tobacco Use   Smoking status: Former    Current packs/day: 0.00    Types: Cigarettes    Quit date: 05/01/2016    Years since quitting: 7.9   Smokeless tobacco: Former    Types: Chew    Quit date: 04/2021   Tobacco comments:    Herbal packs per pt-  random use   Vaping Use   Vaping status: Never Used  Substance and Sexual Activity   Alcohol use: Yes     Alcohol/week: 6.0 standard drinks of alcohol    Types: 6 Cans of beer per week   Drug use: Never   Sexual activity: Not Currently    Birth control/protection: None  Other Topics Concern   Not on file  Social History Narrative   Not on file   Social Drivers of Health   Financial Resource Strain: Low Risk  (03/26/2024)   Overall Financial Resource Strain (CARDIA)    Difficulty of Paying Living Expenses: Not hard at all  Food Insecurity: No Food Insecurity (03/26/2024)   Hunger Vital Sign    Worried About Running Out of Food in the Last Year: Never true    Ran Out of Food in the Last Year: Never true  Transportation Needs: No Transportation Needs (03/26/2024)   PRAPARE - Administrator, Civil Service (Medical): No    Lack of Transportation (Non-Medical): No  Physical Activity: Inactive (03/26/2024)   Exercise Vital Sign    Days of Exercise per Week: 0 days    Minutes of Exercise per Session: Not on file  Stress: No Stress Concern Present (03/26/2024)   [employer] of Occupational Health - Occupational Stress Questionnaire    Feeling of Stress: Not at all  Social Connections: Moderately Isolated (03/26/2024)   Social Connection and Isolation Panel    Frequency of Communication with Friends and Family: More than three times a week    Frequency of Social Gatherings with Friends and Family: Twice a week    Attends Religious Services: Never    Database Administrator or Organizations: Yes    Attends Engineer, Structural: More than 4 times per year    Marital Status: Divorced  Catering Manager Violence: Not on file   Social History   Tobacco Use  Smoking Status Former   Current packs/day: 0.00   Types: Cigarettes   Quit date: 05/01/2016   Years since quitting: 7.9  Smokeless Tobacco Former   Types: Chew   Quit date: 04/2021  Tobacco Comments   Herbal packs per pt-  random use  Social History   Substance and Sexual Activity  Alcohol Use Yes    Alcohol/week: 6.0 standard drinks of alcohol   Types: 6 Cans of beer per week    Family History:  Family History  Problem Relation Age of Onset   Hypertension Mother    Hyperlipidemia Mother    Heart failure Mother    Stroke Mother    COPD Father        smoker   Diabetes Father    Hypertension Father    Hypertension Brother    Alcohol abuse Brother    Cancer Paternal Grandfather    Colon cancer Neg Hx    Colon polyps Neg Hx    Esophageal cancer Neg Hx    Rectal cancer Neg Hx    Stomach cancer Neg Hx     Past medical history, surgical history, medications, allergies, family history and social history reviewed with patient today and changes made to appropriate areas of the chart.   Review of Systems  Constitutional: Negative.   HENT: Negative.    Eyes: Negative.   Respiratory: Negative.    Cardiovascular: Negative.   Gastrointestinal: Negative.   Genitourinary: Negative.   Musculoskeletal:  Positive for back pain (tightening).  Skin: Negative.   Neurological:  Positive for tingling (in left arm and left leg).  Psychiatric/Behavioral: Negative.     All other ROS negative except what is listed above and in the HPI.      Objective:    BP (!) 146/92 (BP Location: Right Arm, Cuff Size: Large)   Pulse 72   Temp (!) 97.2 F (36.2 C)   Ht 5' 11 (1.803 m)   Wt 226 lb 12.8 oz (102.9 kg)   SpO2 98%   BMI 31.63 kg/m   Wt Readings from Last 3 Encounters:  03/27/24 226 lb 12.8 oz (102.9 kg)  11/22/23 215 lb 3.2 oz (97.6 kg)  11/07/23 216 lb 8 oz (98.2 kg)    Physical Exam Vitals and nursing note reviewed.  Constitutional:      General: He is not in acute distress.    Appearance: Normal appearance.  HENT:     Head: Normocephalic and atraumatic.     Right Ear: Tympanic membrane, ear canal and external ear normal.     Left Ear: Tympanic membrane, ear canal and external ear normal.     Mouth/Throat:     Mouth: Mucous membranes are moist.     Pharynx: No posterior  oropharyngeal erythema.  Eyes:     Conjunctiva/sclera: Conjunctivae normal.  Cardiovascular:     Rate and Rhythm: Normal rate and regular rhythm.     Pulses: Normal pulses.     Heart sounds: Normal heart sounds.  Pulmonary:     Effort: Pulmonary effort is normal.     Breath sounds: Normal breath sounds.  Abdominal:     Palpations: Abdomen is soft.     Tenderness: There is no abdominal tenderness.  Musculoskeletal:        General: Normal range of motion.     Cervical back: Normal range of motion and neck supple. No tenderness.     Right lower leg: No edema.     Left lower leg: No edema.     Comments: Strength 5/5 in bilateral upper and lower extremities  Lymphadenopathy:     Cervical: No cervical adenopathy.  Skin:    General: Skin is warm and dry.  Neurological:     General: No focal deficit present.  Mental Status: He is alert and oriented to person, place, and time.     Cranial Nerves: No cranial nerve deficit.     Coordination: Coordination normal.     Gait: Gait normal.  Psychiatric:        Mood and Affect: Mood normal.        Behavior: Behavior normal.        Thought Content: Thought content normal.        Judgment: Judgment normal.     Results for orders placed or performed in visit on 11/07/23  CBC with Differential ([facility] Only)   Collection Time: 11/07/23 12:06 PM  Result Value Ref Range   WBC Count 7.2 4.0 - 10.5 K/uL   RBC 4.96 4.22 - 5.81 MIL/uL   Hemoglobin 15.7 13.0 - 17.0 g/dL   HCT 57.2 60.9 - 47.9 %   MCV 86.1 80.0 - 100.0 fL   MCH 31.7 26.0 - 34.0 pg   MCHC 36.8 (H) 30.0 - 36.0 g/dL   RDW 87.6 88.4 - 84.4 %   Platelet Count 241 150 - 400 K/uL   nRBC 0.0 0.0 - 0.2 %   Neutrophils Relative % 61 %   Neutro Abs 4.4 1.7 - 7.7 K/uL   Lymphocytes Relative 28 %   Lymphs Abs 2.0 0.7 - 4.0 K/uL   Monocytes Relative 9 %   Monocytes Absolute 0.6 0.1 - 1.0 K/uL   Eosinophils Relative 1 %   Eosinophils Absolute 0.1 0.0 - 0.5 K/uL   Basophils  Relative 1 %   Basophils Absolute 0.0 0.0 - 0.1 K/uL   Immature Granulocytes 0 %   Abs Immature Granulocytes 0.03 0.00 - 0.07 K/uL  CMP (Cancer Center only)   Collection Time: 11/07/23 12:06 PM  Result Value Ref Range   Sodium 136 135 - 145 mmol/L   Potassium 3.5 3.5 - 5.1 mmol/L   Chloride 101 98 - 111 mmol/L   CO2 26 22 - 32 mmol/L   Glucose, Bld 98 70 - 99 mg/dL   BUN 13 6 - 20 mg/dL   Creatinine 8.97 9.38 - 1.24 mg/dL   Calcium  9.1 8.9 - 10.3 mg/dL   Total Protein 7.0 6.5 - 8.1 g/dL   Albumin 4.3 3.5 - 5.0 g/dL   AST 15 15 - 41 U/L   ALT 20 0 - 44 U/L   Alkaline Phosphatase 63 38 - 126 U/L   Total Bilirubin 0.9 0.0 - 1.2 mg/dL   GFR, Estimated >39 >39 mL/min   Anion gap 9 5 - 15  Iron and Iron Binding Capacity ([facility] only)   Collection Time: 11/07/23 12:06 PM  Result Value Ref Range   Iron 134 45 - 182 ug/dL   TIBC 653 749 - 549 ug/dL   Saturation Ratios 39 17.9 - 39.5 %   UIBC 212 117 - 376 ug/dL  Ferritin   Collection Time: 11/07/23 12:06 PM  Result Value Ref Range   Ferritin 440 (H) 24 - 336 ng/mL      Assessment & Plan:   Problem List Items Addressed This Visit       Cardiovascular and Mediastinum   Essential hypertension   Chronic, ongoing. BP well controlled at home, but has been out of telmisartan -hydrochlorothiazide for a few days. Continue clonidine  0.1mg  BID and telmisartan -hydrochlorothiazide 80-25mg  daily. Check CMP, CBC today. Follow-up in 6 months.       Relevant Medications   telmisartan -hydrochlorothiazide (MICARDIS  HCT) 80-25 MG tablet   Other Relevant Orders  CBC with Differential/Platelet   Comprehensive metabolic panel with GFR     Nervous and Auditory   Neuropathy   Chronic, ongoing in his left arm and left s/p stroke. Lyrica  did not help either and he would like to hold off on further medication at this point. Follow-up with any concerns.         Other   Hereditary hemochromatosis   He has been getting therapeutic phlebotomy.  Continue collaboration and recommendations from hematology. Check CBC, iron, ferritin today.       Relevant Orders   CBC with Differential/Platelet   Ferritin   Iron   History of CVA (cerebrovascular accident)   Ischemic CVA in 2023. Continue aspirin  81mg  daily and rosuvastatin  20mg  3 times a week.       Mixed hyperlipidemia   Chronic, stable. Continue rosuvastatin  20mg  3 times a week. Check CMP, CBC, lipid panel today.       Relevant Medications   telmisartan -hydrochlorothiazide (MICARDIS  HCT) 80-25 MG tablet   Other Relevant Orders   CBC with Differential/Platelet   Comprehensive metabolic panel with GFR   Lipid panel   Routine general medical examination at a health care facility - Primary   Health maintenance reviewed and updated. Discussed nutrition, exercise. Follow-up 1 year.        Chronic midline low back pain without sciatica   Chronic, stable. Discussed lifting with good ergonomics. Start flexeril  5mg  TID prn back tightness/spasm. Discussed this can make him sleepy. Follow-up with any concerns.       Relevant Medications   cyclobenzaprine  (FLEXERIL ) 5 MG tablet   Other Visit Diagnoses       Screening PSA (prostate specific antigen)       Screen PSA today   Relevant Orders   PSA     Immunization due       Flu and prevnar 20 given today   Relevant Orders   Flu vaccine trivalent PF, 6mos and older(Flulaval,Afluria,Fluarix,Fluzone) (Completed)   Pneumococcal conjugate vaccine 20-valent (Completed)        LABORATORY TESTING:  Health maintenance labs ordered today as discussed above.   The natural history of prostate cancer and ongoing controversy regarding screening and potential treatment outcomes of prostate cancer has been discussed with the patient. The meaning of a false positive PSA and a false negative PSA has been discussed. He indicates understanding of the limitations of this screening test and wishes to proceed with screening PSA  testing.   IMMUNIZATIONS:   - Tdap: Tetanus vaccination status reviewed: last tetanus booster within 10 years. - Influenza: Administered today - Pneumovax: Not applicable - Prevnar: Administered today - HPV: Not applicable - Shingrix  vaccine: Up to date  SCREENING: - Colonoscopy: Up to date  Discussed with patient purpose of the colonoscopy is to detect colon cancer at curable precancerous or early stages   - AAA Screening: Not applicable   PATIENT COUNSELING:    Sexuality: Discussed sexually transmitted diseases, partner selection, use of condoms, avoidance of unintended pregnancy  and contraceptive alternatives.   Advised to avoid cigarette smoking.  I discussed with the patient that most people either abstain from alcohol or drink within safe limits (<=14/week and <=4 drinks/occasion for males, <=7/weeks and <= 3 drinks/occasion for females) and that the risk for alcohol disorders and other health effects rises proportionally with the number of drinks per week and how often a drinker exceeds daily limits.  Discussed cessation/primary prevention of drug use and availability of treatment for abuse.  Diet: Encouraged to adjust caloric intake to maintain  or achieve ideal body weight, to reduce intake of dietary saturated fat and total fat, to limit sodium intake by avoiding high sodium foods and not adding table salt, and to maintain adequate dietary potassium and calcium  preferably from fresh fruits, vegetables, and low-fat dairy products.    stressed the importance of regular exercise  Injury prevention: Discussed safety belts, safety helmets, smoke detector, smoking near bedding or upholstery.   Dental health: Discussed importance of regular tooth brushing, flossing, and dental visits.   Follow up plan: NEXT PREVENTATIVE PHYSICAL DUE IN 1 YEAR. Return in about 6 months (around 09/25/2024) for HTN.  Scotlyn Mccranie A Amila Callies

## 2024-03-28 ENCOUNTER — Ambulatory Visit: Payer: Self-pay | Admitting: Nurse Practitioner

## 2024-05-11 ENCOUNTER — Other Ambulatory Visit: Payer: Self-pay

## 2024-05-14 ENCOUNTER — Telehealth: Payer: Self-pay | Admitting: Hematology

## 2024-05-14 ENCOUNTER — Inpatient Hospital Stay

## 2024-05-14 NOTE — Telephone Encounter (Signed)
 Patient called to cancel his appts says he does not need them.

## 2024-05-15 ENCOUNTER — Inpatient Hospital Stay: Admitting: Hematology

## 2024-05-15 ENCOUNTER — Inpatient Hospital Stay
